# Patient Record
Sex: Male | Born: 1955 | Race: Black or African American | Hispanic: No | Marital: Single | Smoking: Never smoker
Health system: Southern US, Community
[De-identification: ages and names within clinical notes are randomized; demographics above are authoritative.]

## PROBLEM LIST (undated history)

## (undated) DIAGNOSIS — I10 Essential (primary) hypertension: Secondary | ICD-10-CM

## (undated) DIAGNOSIS — H9190 Unspecified hearing loss, unspecified ear: Secondary | ICD-10-CM

## (undated) HISTORY — PX: CHOLECYSTECTOMY: SHX55

---

## 2007-03-01 ENCOUNTER — Emergency Department (HOSPITAL_COMMUNITY): Admission: EM | Admit: 2007-03-01 | Discharge: 2007-03-01 | Payer: Self-pay | Admitting: Emergency Medicine

## 2010-10-16 ENCOUNTER — Inpatient Hospital Stay (HOSPITAL_COMMUNITY)
Admission: EM | Admit: 2010-10-16 | Discharge: 2010-10-19 | DRG: 419 | Disposition: A | Discharge status: Home / Self Care | Payer: Medicaid Other | Attending: Emergency Medicine | Admitting: Emergency Medicine

## 2010-10-16 DIAGNOSIS — E860 Dehydration: Secondary | ICD-10-CM | POA: Diagnosis present

## 2010-10-16 DIAGNOSIS — K8000 Calculus of gallbladder with acute cholecystitis without obstruction: Principal | ICD-10-CM | POA: Diagnosis present

## 2010-10-16 DIAGNOSIS — I10 Essential (primary) hypertension: Secondary | ICD-10-CM | POA: Diagnosis present

## 2010-10-16 DIAGNOSIS — H9190 Unspecified hearing loss, unspecified ear: Secondary | ICD-10-CM | POA: Diagnosis present

## 2010-10-16 LAB — POCT I-STAT, CHEM 8
Chloride: 103 mEq/L (ref 96–112)
Creatinine, Ser: 1.6 mg/dL — ABNORMAL HIGH (ref 0.4–1.5)
Glucose, Bld: 116 mg/dL — ABNORMAL HIGH (ref 70–99)
HCT: 48 % (ref 39.0–52.0)
Potassium: 3.9 mEq/L (ref 3.5–5.1)
Sodium: 134 mEq/L — ABNORMAL LOW (ref 135–145)

## 2010-10-16 LAB — CBC
HCT: 45 % (ref 39.0–52.0)
Hemoglobin: 14.9 g/dL (ref 13.0–17.0)
MCV: 81.2 fL (ref 78.0–100.0)
RDW: 13.1 % (ref 11.5–15.5)
WBC: 22.2 10*3/uL — ABNORMAL HIGH (ref 4.0–10.5)

## 2010-10-16 LAB — COMPREHENSIVE METABOLIC PANEL
AST: 19 U/L (ref 0–37)
Albumin: 3.6 g/dL (ref 3.5–5.2)
BUN: 15 mg/dL (ref 6–23)
Calcium: 8.7 mg/dL (ref 8.4–10.5)
Chloride: 100 mEq/L (ref 96–112)
Creatinine, Ser: 1.45 mg/dL (ref 0.4–1.5)
GFR calc Af Amer: >60 mL/min (ref >60)
Total Protein: 7.4 g/dL (ref 6.0–8.3)

## 2010-10-16 LAB — DIFFERENTIAL
Eosinophils Relative: 0 % (ref 0–5)
Lymphocytes Relative: 7 % — ABNORMAL LOW (ref 12–46)
Lymphs Abs: 1.6 10*3/uL (ref 0.7–4.0)
Monocytes Absolute: 2.8 10*3/uL — ABNORMAL HIGH (ref 0.1–1.0)
Neutro Abs: 17.7 10*3/uL — ABNORMAL HIGH (ref 1.7–7.7)

## 2010-10-16 LAB — PROTIME-INR: INR: 1.2 (ref 0.00–1.49)

## 2010-10-16 LAB — POCT CARDIAC MARKERS
CKMB, poc: 1.2 ng/mL (ref 1.0–8.0)
Myoglobin, poc: 70.3 ng/mL (ref 12–200)
Troponin i, poc: <0.05 ng/mL (ref 0.00–0.09)

## 2010-10-17 LAB — CBC
HCT: 39.1 % (ref 39.0–52.0)
Hemoglobin: 12.3 g/dL — ABNORMAL LOW (ref 13.0–17.0)
MCH: 26 pg (ref 26.0–34.0)
MCV: 82.7 fL (ref 78.0–100.0)
Platelets: 242 10*3/uL (ref 150–400)
RBC: 4.73 MIL/uL (ref 4.22–5.81)
WBC: 13.8 10*3/uL — ABNORMAL HIGH (ref 4.0–10.5)

## 2010-10-17 LAB — COMPREHENSIVE METABOLIC PANEL
AST: 32 U/L (ref 0–37)
Albumin: 2.8 g/dL — ABNORMAL LOW (ref 3.5–5.2)
Alkaline Phosphatase: 59 U/L (ref 39–117)
BUN: 13 mg/dL (ref 6–23)
Chloride: 107 mEq/L (ref 96–112)
GFR calc Af Amer: >60 mL/min (ref >60)
Potassium: 4.2 mEq/L (ref 3.5–5.1)
Total Bilirubin: 1.7 mg/dL — ABNORMAL HIGH (ref 0.3–1.2)
Total Protein: 6.2 g/dL (ref 6.0–8.3)

## 2010-10-17 LAB — CARDIAC PANEL(CRET KIN+CKTOT+MB+TROPI)
CK, MB: 1.8 ng/mL (ref 0.3–4.0)
Total CK: 76 U/L (ref 7–232)

## 2010-10-18 LAB — SURGICAL PCR SCREEN
MRSA, PCR: NEGATIVE
Staphylococcus aureus: NEGATIVE

## 2010-11-09 NOTE — Op Note (Signed)
NAME:  Joseph Hansen, Joseph Hansen NO.:  [id number]  MEDICAL RECORD NO.:  [id number]          PATIENT TYPE:  INP  LOCATION:  5507                         FACILITY:  [facility]  PHYSICIAN:  Velora Heckler, MD      DATE OF BIRTH:  20-Mar-1956  DATE OF PROCEDURE:  10/17/2010 DATE OF DISCHARGE:                              OPERATIVE REPORT   PREOPERATIVE DIAGNOSES:  Acute cholecystitis, cholelithiasis.  POSTOPERATIVE DIAGNOSIS:  Acute cholecystitis, cholelithiasis.  PROCEDURE:  Laparoscopic cholecystectomy with intraoperative cholangiography.  SURGEON:  Velora Heckler, MD  ASSISTANT:  Eber Hong, PA  ANESTHESIA:  General per Judie Petit, MD  ESTIMATED BLOOD LOSS:  Minimal.  PREPARATION:  ChloraPrep.  COMPLICATIONS:  None.  INDICATIONS:  The patient is a 55 year old black male admitted on the Surgical Service from the emergency department with 4-day history of right upper quadrant abdominal pain, nausea and vomiting.  The patient's white blood cell count was elevated at 22,000.  CT scan abdomen and pelvis showed a thickened gallbladder containing gallstones.  The patient now comes to surgery for cholecystectomy for acute cholecystitis and cholelithiasis.  PROCEDURE IN DETAIL:  Procedure was done in OR #17 at the [facility].  The patient was brought to the operating room, placed in supine position on the operating room table.  Following administration of general anesthesia, the patient is positioned and then prepped and draped in the usual strict aseptic fashion.  After ascertaining that an adequate level of anesthesia had been achieved, an infraumbilical incision was made in the midline with a #15 blade. Dissection was carried down through the subcutaneous tissues.  Fascia was incised in the midline and the peritoneal cavity was entered cautiously.  0 Vicryl pursestring suture was placed in the fascia, and Hasson cannula was introduced  under direct vision and secured with a pursestring suture.  Abdomen was insufflated with carbon dioxide. Laparoscope was introduced and the abdomen explored.  Operative ports were placed in the right upper quadrant in the midline, midclavicular line, and anterior axillary line.  With some difficulty, the omentum was mobilized off the undersurface of the liver in the gallbladder. Gallbladder was identified after puncturing the dome of the gallbladder with the suction evacuator.  Gallbladder wall was then grasped and retracted cephalad.  Gallbladder contains concretions of stones, which made it difficult to grasp.  It was notably thick walled.  There is edema in the tissues.  With some difficulty, the neck of the gallbladder was grasped.  The peritoneum was incised and the neck of the gallbladder was dissected out.  Cystic duct was identified.  A clip was placed at the neck of the gallbladder.  A small branch of the cystic artery was clipped and divided with the electrocautery.  Cystic duct was incised with the scissors.  Yellow bile emanates from the cystic duct.  Using a Cook cholangiography catheter through a stab wound in the right upper quadrant, the duct was cannulated.  It was secured with a ligature clip. Using C-arm fluoroscopy, real time cholangiography was performed.  There was rapid filling of a normal caliber biliary tree.  There was free flow distally into the duodenum without filling defect or obstruction.  There was reflux of contrast into the right and left hepatic ductal systems. Clip was withdrawn and Cook catheter was removed from the peritoneal cavity.  Cystic duct was triply clipped and divided.  Branches of the cystic artery were dissected out individually and divided between Ligaclips.  Gallbladder was then excised from the gallbladder bed using the hook electrocautery for hemostasis.  Gallbladder was decidedly intrahepatic and requires extensive dissection of the  liver parenchyma in order to completely excise the gallbladder.  It was placed into an EndoCatch bag and then withdrawn through the umbilical port.  0 Vicryl pursestring suture was tied securely.  Right upper quadrant was irrigated copiously with warm saline.  A 19-French Blake drain was brought in from the lateral port site and placed beneath the liver. Drain was secured to the skin with a 3-0 nylon suture.  Good hemostasis was noted.  Fluid was evacuated from the peritoneal cavity.  Ports were removed and good hemostasis was noted at all port sites. Pneumoperitoneum was released.  Drain was placed to bulb suction.  Port sites were anesthetized with local anesthetic.  Wounds were closed with interrupted 4-0 Monocryl subcuticular sutures.  Wounds were washed and dried and Benzoin and Steri-Strips were applied.  Sterile dressings are applied.  The patient was awakened from anesthesia and brought to the recovery room.  The patient tolerated the procedure well.     Velora Heckler, MD     TMG/MEDQ  D:  10/17/2010  T:  10/18/2010  Job:  [id number]  Electronically Signed by Darnell Level MD on 11/09/2010 10:27:56 AM

## 2010-11-09 NOTE — Discharge Summary (Signed)
NAME:  Joseph Hansen, Joseph Hansen NO.:  [id number]  MEDICAL RECORD NO.:  [id number]           PATIENT TYPE:  I  LOCATION:  5507                         FACILITY:  [facility]  PHYSICIAN:  Velora Heckler, MD      DATE OF BIRTH:  [birth year]  DATE OF ADMISSION:  10/16/2010 DATE OF DISCHARGE:  10/19/2010                              DISCHARGE SUMMARY   HISTORY OF PRESENT ILLNESS:  [name] is a 55 year old gentleman who presented with approximately 4-5 days of right upper quadrant pain associated with nausea and vomiting.  He also did have a history of uncontrolled hypertension and presumptive hypertensive as well. Therefore, the emergency department did work him up for possibility of atypical chest pain including an EKG which did show some ST-segment changes in both inferior and anterior leads, however, that was not suspicious for acute MI.  His cardiac enzymes were within normal limits upon evaluation in the emergency department.  However, workup consisted of labs and diagnostic studies.  Interestingly, his white blood cell count was elevated at 22,000.  His bilirubin was slightly elevated at 1.6.  This prompted a CT of the abdomen and pelvis which subsequently showed evidence of contracted gallbladder with a large stone with some gallbladder wall thickening and pericholecystic edema.  These findings were subsequently relayed for a surgical consult.  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  Interpreter services were used throughout his hospital course.  SUMMARY OF HOSPITAL COURSE:  The patient was subsequently admitted on October 16, 2010, after consultation by Dr. Darnell Level.  Decision was made to admit the patient for IV antibiotics, IV fluids, and control of his uncontrolled hypertension.  After 24 hours at which point the patient's cardiac enzymes were repeated and continued to remain normal. His white blood cell count has improved  from 22,000-14,000.  At this point, with the patient feeling better and being a little bit more medically resuscitated, it was decided that the patient should be acceptable for operative intervention.  The patient was taken to the operating room on October 17, 2010, and underwent laparoscopic cholecystectomy with intraoperative cholangiogram.  Cholangiogram did not show any evidence of obstruction. The gallbladder was quite acute, but I was successful in removal through the laparoscopic technique.  Postoperatively, the patient has done well without any residual nausea or vomiting.  His pain control has been converted to oral form.  His blood pressure has remained stable on the IV medications and he will be transitioned to oral medications. However, we felt at this point on postoperative day #2 that the patient is stable for discharge home.  DISCHARGE DIAGNOSES: 1. Acute cholecystitis, status post laparoscopic cholecystectomy. 2. Uncontrolled hypertension - improved on hypertensive     antihypertensive agents.  PLAN:  The patient will be discharged on the following medications: Lisinopril 10 mg daily, Lopressor 25 mg twice daily, and Percocet 1-2 tablets q.6 h. p.r.n. severe pain.  He is given preprinted discharge instructions to follow regarding laparoscopic cholecystectomy surgery. He is also given instructions to follow a low-sodium diet to aid in his blood pressure  management.  He is urged to follow up with primary care team within the next coming weeks for blood pressure management.  Given the EKG findings and his chronic uncontrolled hypertension, the patient may be a candidate for cardiology consult or stress testing.  He will come back in our office in approximately 1 week to have his drain removed or sooner should any issues arise.     Brayton El, PA-C   ______________________________ Velora Heckler, MD    KB/MEDQ  D:  10/19/2010  T:  10/20/2010  Job:   [id number]  Electronically Signed by Brayton El  on 10/23/2010 01:34:10 PM Electronically Signed by Darnell Level MD on 11/09/2010 10:26:48 AM

## 2010-11-09 NOTE — H&P (Signed)
NAME:  Joseph Hansen, Joseph Hansen NO.:  [id number]  MEDICAL RECORD NO.:  [id number]          PATIENT TYPE:  INP  LOCATION:  5507                         FACILITY:  [facility]  PHYSICIAN:  Velora Heckler, MD      DATE OF BIRTH:  [birth year]  DATE OF ADMISSION:  10/16/2010 DATE OF DISCHARGE:                             HISTORY & PHYSICAL   PRIMARY CARE PHYSICIAN:  [facility].  CHIEF COMPLAINT:  Abdominal pain.  HISTORY OF PRESENT ILLNESS:  Joseph Hansen is a 55 year old pleasant African American gentleman who presents with approximately 4 days of right upper quadrant abdominal pain associated with nausea and vomiting. He states his bowel movements have become less frequent.  However, he does not feel distended or bloated.  He has not had any fever or chills. He denies any chest pain, shortness of breath, or palpitations.  He denies any dysuria, hematuria, has had no prior history of any abdominal or gastrointestinal disorders.  No known history of gallbladder disease. He was brought to the emergency department by his family with these ongoing complaints and workup in the emergency department has found evidence possibly consistent with acute cholecystitis.  CT scan shows gallstones and gallbladder wall thickening as well as an elevated white blood cell count.  PAST MEDICAL HISTORY:  Significant for hypertension for which the patient has not taken any medication in some time.  He is deaf.  His mother is his power of attorney.  PAST SURGICAL HISTORY:  Negative.  FAMILY HISTORY:  Noncontributory at present case.  SOCIAL HISTORY:  The patient denies any tobacco, alcohol, or illicit drug use.  Again, he is deaf and thus communicates with sign language.  DRUG ALLERGIES:  No known drug or latex allergies.  MEDICATIONS:  None chronic.  REVIEW OF SYSTEMS:  Please see history of present illness for pertinent findings.  PHYSICAL EXAMINATION:  GENERAL:  A 55 year old African  American gentleman who is nontoxic-appearing. VITAL SIGNS:  Temperature is 98.7, blood pressure 170/115, respiratory rate of 24, heart rate of 94, oxygen saturation 96-97% on room air. ENT:  Unremarkable. NECK:  Supple without lymphadenopathy.  Trachea is midline.  No thyromegaly or masses. LUNGS:  Clear to auscultation.  No wheezes, rhonchi, or rales.  Normal respiratory effort without use of accessory muscles. HEART:  Regular rate and rhythm.  No murmurs, gallops, or rubs. Carotids 2+ brisk and without bruit.  Peripheral pulses are intact and symmetrical. ABDOMEN:  Soft and nondistended.  The patient is tender in the right upper quadrant without guarding or rebound.  No mass effect or hernias are appreciated.  No organomegaly is evident. RECTAL:  Deferred. EXTREMITIES:  Good active range of motion in all extremities without crepitus or pain.  Normal muscle strength and tone without atrophy. SKIN:  Otherwise warm and dry with good turgor.  No rashes, lesions, or nodules. NEUROLOGIC:  The patient is alert and oriented x3.  Cranial nerves II- XII are grossly intact.  DIAGNOSTICS:  CBC today shows a white blood cell count of 22.2 with a mild shift, hemoglobin of 14.9, hematocrit of 45.0, platelet count of 264.  Metabolic panel  shows sodium of 134, potassium of 3.9, chloride of 100, CO2 of 22, BUN of 16, creatinine of 1.6.  Blood sugar of 114.  Liver enzymes show bilirubin of 1.6, alkaline phosphatase of 68, AST of 19, ALT of 21.  Lipase is normal at 18.  INR is normal at 1.2.  IMAGING:  Chest x-ray showed poor respiratory effort, but possible patchy airspace opacity in the right lung base.  Subsequent CT scan shows bibasilar atelectasis, likely due to poor respiratory effort. However, it also shows contracted gallbladder with a 2.5 cm gallstone as well as a 1.5 cm gallstone.  There is an associated wall thickening and pericholecystic fluid.  IMPRESSION: 1. Acute  cholecystitis. 2. Uncontrolled hypertension. 3. Dehydration.  PLAN:  We will admit the patient to continue IV fluid hydration, begin IV antibiotics consisting of Unasyn 3 g IV q.6 h. and we will plan for cholecystectomy this admit.  I have discussed with the patient via sign language interpreter as well as his family the need for surgical intervention as well as the procedure itself including risks, complications, and postoperative expectations.  The patient and family were agreeable to our plan.     Brayton El, PA-C   ______________________________ Velora Heckler, MD    KB/MEDQ  D:  10/16/2010  T:  10/17/2010  Job:  [id number]  Electronically Signed by Brayton El  on 10/18/2010 10:51:03 AM Electronically Signed by Darnell Level MD on 11/09/2010 10:27:10 AM

## 2011-07-05 LAB — BASIC METABOLIC PANEL
BUN: 15
CO2: 25
Calcium: 9.1
Chloride: 110
Creatinine, Ser: 1.14

## 2011-07-05 LAB — URINALYSIS, ROUTINE W REFLEX MICROSCOPIC
Hgb urine dipstick: NEGATIVE
Nitrite: NEGATIVE
Specific Gravity, Urine: >1.03 — ABNORMAL HIGH
Urobilinogen, UA: 0.2
pH: 6

## 2011-07-05 LAB — URINE CULTURE

## 2011-07-05 LAB — DIFFERENTIAL
Basophils Absolute: 0
Basophils Relative: 1
Eosinophils Absolute: 0.2
Monocytes Relative: 12 — ABNORMAL HIGH
Neutrophils Relative %: 58

## 2011-07-05 LAB — URINE MICROSCOPIC-ADD ON

## 2011-07-05 LAB — POCT CARDIAC MARKERS
Myoglobin, poc: 98.9
Operator id: 264761

## 2011-07-05 LAB — CBC
MCHC: 33.7
MCV: 81
Platelets: 281
RDW: 13.7

## 2015-03-26 ENCOUNTER — Emergency Department (HOSPITAL_COMMUNITY): Payer: Medicaid Other

## 2015-03-26 ENCOUNTER — Encounter (HOSPITAL_COMMUNITY): Payer: Self-pay | Admitting: Emergency Medicine

## 2015-03-26 ENCOUNTER — Emergency Department (HOSPITAL_COMMUNITY)
Admission: EM | Admit: 2015-03-26 | Discharge: 2015-03-26 | Disposition: A | Discharge status: Home / Self Care | Payer: Medicaid Other | Attending: Emergency Medicine | Admitting: Emergency Medicine

## 2015-03-26 DIAGNOSIS — Z79899 Other long term (current) drug therapy: Secondary | ICD-10-CM | POA: Diagnosis not present

## 2015-03-26 DIAGNOSIS — I10 Essential (primary) hypertension: Secondary | ICD-10-CM | POA: Diagnosis present

## 2015-03-26 DIAGNOSIS — R0600 Dyspnea, unspecified: Secondary | ICD-10-CM | POA: Diagnosis not present

## 2015-03-26 LAB — CBC WITH DIFFERENTIAL/PLATELET
Basophils Absolute: 0.1 10*3/uL (ref 0.0–0.1)
Basophils Relative: 1 % (ref 0–1)
EOS PCT: 4 % (ref 0–5)
Eosinophils Absolute: 0.4 10*3/uL (ref 0.0–0.7)
HEMATOCRIT: 40.5 % (ref 39.0–52.0)
Hemoglobin: 12.9 g/dL — ABNORMAL LOW (ref 13.0–17.0)
LYMPHS ABS: 2.3 10*3/uL (ref 0.7–4.0)
LYMPHS PCT: 24 % (ref 12–46)
MCH: 26.8 pg (ref 26.0–34.0)
MCHC: 31.9 g/dL (ref 30.0–36.0)
MCV: 84.2 fL (ref 78.0–100.0)
MONO ABS: 0.9 10*3/uL (ref 0.1–1.0)
MONOS PCT: 9 % (ref 3–12)
Neutro Abs: 5.8 10*3/uL (ref 1.7–7.7)
Neutrophils Relative %: 62 % (ref 43–77)
PLATELETS: 293 10*3/uL (ref 150–400)
RBC: 4.81 MIL/uL (ref 4.22–5.81)
RDW: 13.2 % (ref 11.5–15.5)
WBC: 9.4 10*3/uL (ref 4.0–10.5)

## 2015-03-26 LAB — COMPREHENSIVE METABOLIC PANEL
ALBUMIN: 4 g/dL (ref 3.5–5.0)
ALT: 23 U/L (ref 17–63)
AST: 21 U/L (ref 15–41)
Alkaline Phosphatase: 89 U/L (ref 38–126)
Anion gap: 8 (ref 5–15)
BILIRUBIN TOTAL: 0.8 mg/dL (ref 0.3–1.2)
BUN: 19 mg/dL (ref 6–20)
CHLORIDE: 107 mmol/L (ref 101–111)
CO2: 25 mmol/L (ref 22–32)
Calcium: 8.9 mg/dL (ref 8.9–10.3)
Creatinine, Ser: 1.29 mg/dL — ABNORMAL HIGH (ref 0.61–1.24)
GFR calc Af Amer: >60 mL/min (ref >60)
GFR, EST NON AFRICAN AMERICAN: 58 mL/min — AB (ref >60)
Glucose, Bld: 75 mg/dL (ref 65–99)
Potassium: 3.8 mmol/L (ref 3.5–5.1)
SODIUM: 140 mmol/L (ref 135–145)
Total Protein: 7.2 g/dL (ref 6.5–8.1)

## 2015-03-26 LAB — I-STAT CHEM 8, ED
BUN: 19 mg/dL (ref 6–20)
CALCIUM ION: 1.22 mmol/L (ref 1.13–1.30)
Chloride: 105 mmol/L (ref 101–111)
Creatinine, Ser: 1.3 mg/dL — ABNORMAL HIGH (ref 0.61–1.24)
Glucose, Bld: 76 mg/dL (ref 65–99)
HEMATOCRIT: 44 % (ref 39.0–52.0)
Hemoglobin: 15 g/dL (ref 13.0–17.0)
Potassium: 3.9 mmol/L (ref 3.5–5.1)
SODIUM: 141 mmol/L (ref 135–145)
TCO2: 22 mmol/L (ref 0–100)

## 2015-03-26 MED ORDER — AMLODIPINE BESYLATE 10 MG PO TABS: 10.0000 mg | ORAL_TABLET | Freq: Every day | ORAL | Status: DC

## 2015-03-26 MED ORDER — BENAZEPRIL HCL 10 MG PO TABS: 10.0000 mg | ORAL_TABLET | Freq: Every day | ORAL | Status: DC

## 2015-03-26 NOTE — Discharge Instructions (Signed)
Hypertension Hypertension is another name for high blood pressure. High blood pressure forces your heart to work harder to pump blood. A blood pressure reading has two numbers, which includes a higher number over a lower number (example: 110/72). HOME CARE   Have your blood pressure rechecked by your doctor.  Only take medicine as told by your doctor. Follow the directions carefully. The medicine does not work as well if you skip doses. Skipping doses also puts you at risk for problems.  Do not smoke.  Monitor your blood pressure at home as told by your doctor. GET HELP IF:  You think you are having a reaction to the medicine you are taking.  You have repeat headaches or feel dizzy.  You have puffiness (swelling) in your ankles.  You have trouble with your vision. GET HELP RIGHT AWAY IF:   You get a very bad headache and are confused.  You feel weak, numb, or faint.  You get chest or belly (abdominal) pain.  You throw up (vomit).  You cannot breathe very well. MAKE SURE YOU:   Understand these instructions.  Will watch your condition.  Will get help right away if you are not doing well or get worse. Document Released: 02/20/2008 Document Revised: 09/08/2013 Document Reviewed: 06/26/2013 Exit[employer] Patient Information 2015 [employer]. This information is not intended to replace advice given to you by your health care provider. Make sure you discuss any questions you have with your health care provider.  

## 2015-03-26 NOTE — ED Notes (Addendum)
PT is deaf and family reports not being seen by a primary doctor in over 3 years and the past week has been having s/s of HTN including headache and intermittent lightheadedness. PT denies any CP or weakness.

## 2015-03-26 NOTE — ED Provider Notes (Signed)
CSN: [id number]     Arrival date & time 03/26/15  1412 History   First MD Initiated Contact with Patient 03/26/15 1441     Chief Complaint  Patient presents with  . Hypertension    Level V caveat patient is deaf (Consider location/radiation/quality/duration/timing/severity/associated sxs/prior Treatment) HPI History obtained from patient's sister. 59 year old male with a history of hypertension who has not been taking his antihypertensives. He lives with his parents. By his sister's report his mother got a new blood pressure machine yesterday. She was checking everyone's blood pressure and noted that his was very elevated with a systolic of about 672. They rechecked it today and it was again elevated at 200. They deny that he has been having any other complaints and has been his usual self. History reviewed. No pertinent past medical history. Past Surgical History  Procedure Laterality Date  . Cholecystectomy     History reviewed. No pertinent family history. History  Substance Use Topics  . Smoking status: Never Smoker   . Smokeless tobacco: Not on file  . Alcohol Use: No    Review of Systems  Unable to perform ROS     Allergies  Review of patient's allergies indicates no known allergies.  Home Medications   Prior to Admission medications   Medication Sig Start Date End Date Taking? Authorizing Provider  amLODipine (NORVASC) 10 MG tablet Take 1 tablet (10 mg total) by mouth daily. 03/26/15   Pattricia Boss, MD  benazepril (LOTENSIN) 10 MG tablet Take 1 tablet (10 mg total) by mouth daily. 03/26/15   Pattricia Boss, MD   BP 186/107 mmHg  Pulse 63  Temp(Src) 98.3 F (36.8 C) (Oral)  Resp 18  Ht 5\' 7"  (1.702 m)  Wt 182 lb 6 oz (82.725 kg)  BMI 28.56 kg/m2  SpO2 98% Physical Exam  Constitutional: He appears well-developed and well-nourished.  HENT:  Head: Normocephalic and atraumatic.  Right Ear: External ear normal.  Left Ear: External ear normal.  Nose: Nose normal.   Mouth/Throat: Oropharynx is clear and moist.  Eyes: Conjunctivae and EOM are normal. Pupils are equal, round, and reactive to light.  Neck: Normal range of motion. Neck supple.  Cardiovascular: Normal rate, regular rhythm and normal heart sounds.   Pulmonary/Chest: Effort normal and breath sounds normal.  Abdominal: Soft. Bowel sounds are normal.  Musculoskeletal: Normal range of motion.  Neurological: He is alert. He displays normal reflexes. No cranial nerve deficit. He exhibits normal muscle tone. Coordination normal.  Skin: Skin is warm and dry.  Psychiatric: He has a normal mood and affect.    ED Course  Procedures (including critical care time) Labs Review Labs Reviewed  CBC WITH DIFFERENTIAL/PLATELET  COMPREHENSIVE METABOLIC PANEL  I-STAT CHEM 8, ED    Imaging Review No results found.   EKG Interpretation None      MDM   Final diagnoses:  Dyspnea  Essential hypertension    59 year old male history of hypertension not taking medications for several years who presents today with hypertension. He is asymptomatic. Labs checked here showed an initial i-STAT appeared to be hemolyzed repeat was normal. Exception of a mildly elevated creatinine. He is given prescription for Norvasc and Lotensin per antihypertensives guidelines. He is advised to follow-up next week for recheck.    Pattricia Boss, MD 03/26/15 [phone]

## 2015-05-08 ENCOUNTER — Encounter (HOSPITAL_COMMUNITY): Payer: Self-pay | Admitting: Emergency Medicine

## 2015-05-08 ENCOUNTER — Emergency Department (HOSPITAL_COMMUNITY)
Admission: EM | Admit: 2015-05-08 | Discharge: 2015-05-08 | Disposition: A | Discharge status: Home / Self Care | Payer: Medicaid Other | Attending: Emergency Medicine | Admitting: Emergency Medicine

## 2015-05-08 ENCOUNTER — Emergency Department (HOSPITAL_COMMUNITY): Payer: Medicaid Other

## 2015-05-08 DIAGNOSIS — Y998 Other external cause status: Secondary | ICD-10-CM | POA: Diagnosis not present

## 2015-05-08 DIAGNOSIS — S40212A Abrasion of left shoulder, initial encounter: Secondary | ICD-10-CM | POA: Diagnosis not present

## 2015-05-08 DIAGNOSIS — S50811A Abrasion of right forearm, initial encounter: Secondary | ICD-10-CM | POA: Insufficient documentation

## 2015-05-08 DIAGNOSIS — S30811A Abrasion of abdominal wall, initial encounter: Secondary | ICD-10-CM | POA: Insufficient documentation

## 2015-05-08 DIAGNOSIS — S90812A Abrasion, left foot, initial encounter: Secondary | ICD-10-CM | POA: Diagnosis not present

## 2015-05-08 DIAGNOSIS — Y9389 Activity, other specified: Secondary | ICD-10-CM | POA: Diagnosis not present

## 2015-05-08 DIAGNOSIS — T07XXXA Unspecified multiple injuries, initial encounter: Secondary | ICD-10-CM

## 2015-05-08 DIAGNOSIS — I10 Essential (primary) hypertension: Secondary | ICD-10-CM | POA: Insufficient documentation

## 2015-05-08 DIAGNOSIS — S81011A Laceration without foreign body, right knee, initial encounter: Secondary | ICD-10-CM

## 2015-05-08 DIAGNOSIS — S90811A Abrasion, right foot, initial encounter: Secondary | ICD-10-CM | POA: Insufficient documentation

## 2015-05-08 DIAGNOSIS — Y92410 Unspecified street and highway as the place of occurrence of the external cause: Secondary | ICD-10-CM | POA: Insufficient documentation

## 2015-05-08 DIAGNOSIS — S0993XA Unspecified injury of face, initial encounter: Secondary | ICD-10-CM | POA: Diagnosis present

## 2015-05-08 DIAGNOSIS — S0181XA Laceration without foreign body of other part of head, initial encounter: Secondary | ICD-10-CM | POA: Insufficient documentation

## 2015-05-08 HISTORY — DX: Essential (primary) hypertension: I10

## 2015-05-08 LAB — COMPREHENSIVE METABOLIC PANEL
ALT: 16 U/L — ABNORMAL LOW (ref 17–63)
ANION GAP: 10 (ref 5–15)
AST: 18 U/L (ref 15–41)
Albumin: 4.2 g/dL (ref 3.5–5.0)
Alkaline Phosphatase: 73 U/L (ref 38–126)
BUN: 40 mg/dL — ABNORMAL HIGH (ref 6–20)
CALCIUM: 9 mg/dL (ref 8.9–10.3)
CHLORIDE: 105 mmol/L (ref 101–111)
CO2: 21 mmol/L — AB (ref 22–32)
Creatinine, Ser: 2.1 mg/dL — ABNORMAL HIGH (ref 0.61–1.24)
GFR calc Af Amer: 37 mL/min — ABNORMAL LOW (ref >60)
GFR calc non Af Amer: 32 mL/min — ABNORMAL LOW (ref >60)
Glucose, Bld: 127 mg/dL — ABNORMAL HIGH (ref 65–99)
Potassium: 4.1 mmol/L (ref 3.5–5.1)
SODIUM: 136 mmol/L (ref 135–145)
Total Bilirubin: 0.6 mg/dL (ref 0.3–1.2)
Total Protein: 7.5 g/dL (ref 6.5–8.1)

## 2015-05-08 LAB — URINALYSIS, ROUTINE W REFLEX MICROSCOPIC
Bilirubin Urine: NEGATIVE
Glucose, UA: NEGATIVE mg/dL
Hgb urine dipstick: NEGATIVE
KETONES UR: NEGATIVE mg/dL
Leukocytes, UA: NEGATIVE
Nitrite: NEGATIVE
PH: 5.5 (ref 5.0–8.0)
Protein, ur: NEGATIVE mg/dL
Specific Gravity, Urine: 1.02 (ref 1.005–1.030)
Urobilinogen, UA: 0.2 mg/dL (ref 0.0–1.0)

## 2015-05-08 LAB — CBC
HEMATOCRIT: 39.4 % (ref 39.0–52.0)
HEMOGLOBIN: 13.2 g/dL (ref 13.0–17.0)
MCH: 27.8 pg (ref 26.0–34.0)
MCHC: 33.5 g/dL (ref 30.0–36.0)
MCV: 82.9 fL (ref 78.0–100.0)
Platelets: 254 10*3/uL (ref 150–400)
RBC: 4.75 MIL/uL (ref 4.22–5.81)
RDW: 12.9 % (ref 11.5–15.5)
WBC: 10.3 10*3/uL (ref 4.0–10.5)

## 2015-05-08 LAB — I-STAT CG4 LACTIC ACID, ED: Lactic Acid, Venous: 1.52 mmol/L (ref 0.5–2.0)

## 2015-05-08 LAB — SAMPLE TO BLOOD BANK

## 2015-05-08 LAB — PROTIME-INR
INR: 1.14 (ref 0.00–1.49)
Prothrombin Time: 14.7 seconds (ref 11.6–15.2)

## 2015-05-08 LAB — ETHANOL

## 2015-05-08 MED ORDER — IOHEXOL 300 MG/ML  SOLN
100.0000 mL | Freq: Once | INTRAMUSCULAR | Status: AC | PRN
  Administered 2015-05-08: 100 mL via INTRAVENOUS

## 2015-05-08 MED ORDER — POVIDONE-IODINE 10 % EX SOLN
CUTANEOUS | Status: AC
  Filled 2015-05-08: qty 118

## 2015-05-08 MED ORDER — SODIUM CHLORIDE 0.9 % IV SOLN
1000.0000 mL | Freq: Once | INTRAVENOUS | Status: AC
  Administered 2015-05-08: 1000 mL via INTRAVENOUS

## 2015-05-08 MED ORDER — BACITRACIN ZINC 500 UNIT/GM EX OINT: 1.0000 "application " | TOPICAL_OINTMENT | Freq: Two times a day (BID) | CUTANEOUS | Status: DC

## 2015-05-08 MED ORDER — HYDROCODONE-ACETAMINOPHEN 5-325 MG PO TABS: 1.0000 | ORAL_TABLET | Freq: Four times a day (QID) | ORAL | Status: DC | PRN

## 2015-05-08 MED ORDER — LIDOCAINE-EPINEPHRINE (PF) 2 %-1:200000 IJ SOLN
INTRAMUSCULAR | Status: AC
  Administered 2015-05-08: 20 mL
  Filled 2015-05-08: qty 20

## 2015-05-08 MED ORDER — TETANUS-DIPHTH-ACELL PERTUSSIS 5-2.5-18.5 LF-MCG/0.5 IM SUSP
0.5000 mL | Freq: Once | INTRAMUSCULAR | Status: AC
  Administered 2015-05-08: 0.5 mL via INTRAMUSCULAR
  Filled 2015-05-08: qty 0.5

## 2015-05-08 MED ORDER — HYDROCODONE-ACETAMINOPHEN 5-325 MG PO TABS
1.0000 | ORAL_TABLET | Freq: Once | ORAL | Status: AC
  Administered 2015-05-08: 1 via ORAL
  Filled 2015-05-08: qty 1

## 2015-05-08 NOTE — ED Notes (Signed)
Abrasions to right forearm and right hand cleaned and non-adherent dressing with Kerlix applied. Deep abrasions cleaned and dressed with non-adherent and Kerlix on right knee and Great R toe.

## 2015-05-08 NOTE — ED Notes (Signed)
MD at bedside. 
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
## 2015-05-08 NOTE — Discharge Instructions (Signed)
Abrasion An abrasion is a cut or scrape of the skin. Abrasions do not extend through all layers of the skin and most heal within 10 days. It is important to care for your abrasion properly to prevent infection. CAUSES  Most abrasions are caused by falling on, or gliding across, the ground or other surface. When your skin rubs on something, the outer and inner layer of skin rubs off, causing an abrasion. DIAGNOSIS  Your caregiver will be able to diagnose an abrasion during a physical exam.  TREATMENT  Your treatment depends on how large and deep the abrasion is. Generally, your abrasion will be cleaned with water and a mild soap to remove any dirt or debris. An antibiotic ointment may be put over the abrasion to prevent an infection. A bandage (dressing) may be wrapped around the abrasion to keep it from getting dirty.  You may need a tetanus shot if:  You cannot remember when you had your last tetanus shot.  You have never had a tetanus shot.  The injury broke your skin. If you get a tetanus shot, your arm may swell, get red, and feel warm to the touch. This is common and not a problem. If you need a tetanus shot and you choose not to have one, there is a rare chance of getting tetanus. Sickness from tetanus can be serious.  HOME CARE INSTRUCTIONS   If a dressing was applied, change it at least once a day or as directed by your caregiver. If the bandage sticks, soak it off with warm water.   Wash the area with water and a mild soap to remove all the ointment 2 times a day. Rinse off the soap and pat the area dry with a clean towel.   Reapply any ointment as directed by your caregiver. This will help prevent infection and keep the bandage from sticking. Use gauze over the wound and under the dressing to help keep the bandage from sticking.   Change your dressing right away if it becomes wet or dirty.   Only take over-the-counter or prescription medicines for pain, discomfort, or fever as  directed by your caregiver.   Follow up with your caregiver within 24-48 hours for a wound check, or as directed. If you were not given a wound-check appointment, look closely at your abrasion for redness, swelling, or pus. These are signs of infection. SEEK IMMEDIATE MEDICAL CARE IF:   You have increasing pain in the wound.   You have redness, swelling, or tenderness around the wound.   You have pus coming from the wound.   You have a fever or persistent symptoms for more than 2-3 days.  You have a fever and your symptoms suddenly get worse.  You have a bad smell coming from the wound or dressing.  MAKE SURE YOU:   Understand these instructions.  Will watch your condition.  Will get help right away if you are not doing well or get worse. Document Released: 06/13/2005 Document Revised: 08/20/2012 Document Reviewed: 08/07/2011 [facility] Patient Information 2015 [place], [place]. This information is not intended to replace advice given to you by your health care provider. Make sure you discuss any questions you have with your health care provider.  Delayed Wound Closure Sometimes, your health care provider will decide to delay closing a wound for several days. This is done when the wound is badly bruised, dirty, gaping, or when it has been several hours since the injury happened. By delaying the closure of  your wound, the risk of infection is reduced. Wounds that are closed in 3-7 days after being cleaned up and dressed heal just as well as those that are closed right away. HOME CARE INSTRUCTIONS  Rest and elevate the injured area until the pain and swelling are gone.  Have your wound checked as instructed by your health care provider. SEEK MEDICAL CARE IF:  You develop unusual or increased swelling or redness around the wound.  You have increasing pain or tenderness.  There is increasing fluid (drainage) or a bad smelling drainage coming from the wound. Document Released:  09/03/2005 Document Revised: 09/08/2013 Document Reviewed: 03/03/2013 [facility] Patient Information 2015 [place], [place]. This information is not intended to replace advice given to you by your health care provider. Make sure you discuss any questions you have with your health care provider. Absorbable Suture Repair Absorbable sutures (stitches) hold skin together so you can heal. Keep skin wounds clean and dry for the next 2 to 3 days. Then, you may gently wash your wound and dress it with an antibiotic ointment as recommended. As your wound begins to heal, the sutures are no longer needed, and they typically begin to fall off. This will take 7 to 10 days. After 10 days, if your sutures are loose, you can remove them by wiping with a clean gauze pad or a cotton ball. Do not pull your sutures out. They should wipe away easily. If after 10 days they do not easily wipe away, have your caregiver take them out. Absorbable sutures may be used deep in a wound to help hold it together. If these stitches are below the skin, the body will absorb them completely in 3 to 4 weeks.  You may need a tetanus shot if:  You cannot remember when you had your last tetanus shot.  You have never had a tetanus shot. If you get a tetanus shot, your arm may swell, get red, and feel warm to the touch. This is common and not a problem. If you need a tetanus shot and you choose not to have one, there is a rare chance of getting tetanus. Sickness from tetanus can be serious. SEEK IMMEDIATE MEDICAL CARE IF:  You have redness in the wound area.  The wound area feels hot to the touch.  You develop swelling in the wound area.  You develop pain.  There is fluid drainage from the wound. Document Released: 10/11/2004 Document Revised: 11/26/2011 Document Reviewed: 01/23/2011 [facility] Patient Information 2015 [place], [place]. This information is not intended to replace advice given to you by your health care provider. Make sure you  discuss any questions you have with your health care provider.

## 2015-05-08 NOTE — ED Notes (Signed)
Pt. Resting comfortably. 
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
## 2019-06-02 ENCOUNTER — Encounter (HOSPITAL_COMMUNITY): Payer: Self-pay

## 2019-06-02 ENCOUNTER — Emergency Department (HOSPITAL_COMMUNITY): Payer: Medicare Other

## 2019-06-02 ENCOUNTER — Emergency Department (HOSPITAL_COMMUNITY)
Admission: EM | Admit: 2019-06-02 | Discharge: 2019-06-02 | Disposition: A | Discharge status: Home / Self Care | Payer: Medicare Other | Attending: Emergency Medicine | Admitting: Emergency Medicine

## 2019-06-02 ENCOUNTER — Other Ambulatory Visit: Payer: Self-pay

## 2019-06-02 DIAGNOSIS — R0789 Other chest pain: Secondary | ICD-10-CM | POA: Insufficient documentation

## 2019-06-02 DIAGNOSIS — I10 Essential (primary) hypertension: Secondary | ICD-10-CM | POA: Insufficient documentation

## 2019-06-02 DIAGNOSIS — Z79899 Other long term (current) drug therapy: Secondary | ICD-10-CM | POA: Insufficient documentation

## 2019-06-02 HISTORY — DX: Unspecified hearing loss, unspecified ear: H91.90

## 2019-06-02 LAB — COMPREHENSIVE METABOLIC PANEL
ALT: 16 U/L (ref 0–44)
AST: 18 U/L (ref 15–41)
Albumin: 4.6 g/dL (ref 3.5–5.0)
Alkaline Phosphatase: 70 U/L (ref 38–126)
Anion gap: 8 (ref 5–15)
BUN: 17 mg/dL (ref 8–23)
CO2: 26 mmol/L (ref 22–32)
Calcium: 9.6 mg/dL (ref 8.9–10.3)
Chloride: 106 mmol/L (ref 98–111)
Creatinine, Ser: 1.27 mg/dL — ABNORMAL HIGH (ref 0.61–1.24)
GFR calc Af Amer: >60 mL/min (ref >60)
GFR calc non Af Amer: 59 mL/min — ABNORMAL LOW (ref >60)
Glucose, Bld: 99 mg/dL (ref 70–99)
Potassium: 4 mmol/L (ref 3.5–5.1)
Sodium: 140 mmol/L (ref 135–145)
Total Bilirubin: 1 mg/dL (ref 0.3–1.2)
Total Protein: 8.4 g/dL — ABNORMAL HIGH (ref 6.5–8.1)

## 2019-06-02 LAB — CBC WITH DIFFERENTIAL/PLATELET
Abs Immature Granulocytes: 0.02 10*3/uL (ref 0.00–0.07)
Basophils Absolute: 0.1 10*3/uL (ref 0.0–0.1)
Basophils Relative: 1 %
Eosinophils Absolute: 0.3 10*3/uL (ref 0.0–0.5)
Eosinophils Relative: 3 %
HCT: 44.7 % (ref 39.0–52.0)
Hemoglobin: 13.9 g/dL (ref 13.0–17.0)
Immature Granulocytes: 0 %
Lymphocytes Relative: 20 %
Lymphs Abs: 1.9 10*3/uL (ref 0.7–4.0)
MCH: 27.2 pg (ref 26.0–34.0)
MCHC: 31.1 g/dL (ref 30.0–36.0)
MCV: 87.5 fL (ref 80.0–100.0)
Monocytes Absolute: 0.8 10*3/uL (ref 0.1–1.0)
Monocytes Relative: 8 %
Neutro Abs: 6.6 10*3/uL (ref 1.7–7.7)
Neutrophils Relative %: 68 %
Platelets: 292 10*3/uL (ref 150–400)
RBC: 5.11 MIL/uL (ref 4.22–5.81)
RDW: 13.1 % (ref 11.5–15.5)
WBC: 9.7 10*3/uL (ref 4.0–10.5)
nRBC: 0 % (ref 0.0–0.2)

## 2019-06-02 LAB — CBG MONITORING, ED: Glucose-Capillary: 109 mg/dL — ABNORMAL HIGH (ref 70–99)

## 2019-06-02 LAB — TROPONIN I (HIGH SENSITIVITY)
Troponin I (High Sensitivity): 10 ng/L (ref <18)
Troponin I (High Sensitivity): 13 ng/L (ref <18)

## 2019-06-02 LAB — BRAIN NATRIURETIC PEPTIDE: B Natriuretic Peptide: 75 pg/mL (ref 0.0–100.0)

## 2019-06-02 MED ORDER — LABETALOL HCL 100 MG PO TABS: 100.0000 mg | ORAL_TABLET | Freq: Two times a day (BID) | ORAL | 1 refills | Status: DC

## 2019-06-02 MED ORDER — HYDRALAZINE HCL 20 MG/ML IJ SOLN
5.0000 mg | Freq: Once | INTRAMUSCULAR | Status: AC
Start: 2019-06-02 — End: 2019-06-02
  Administered 2019-06-02: 5 mg via INTRAVENOUS
  Filled 2019-06-02: qty 1

## 2019-06-02 MED ORDER — LABETALOL HCL 200 MG PO TABS
100.0000 mg | ORAL_TABLET | Freq: Once | ORAL | Status: AC
  Administered 2019-06-02: 100 mg via ORAL
  Filled 2019-06-02: qty 1

## 2019-06-02 MED ORDER — LABETALOL HCL 5 MG/ML IV SOLN
40.0000 mg | Freq: Once | INTRAVENOUS | Status: AC
  Administered 2019-06-02: 20 mg via INTRAVENOUS
  Filled 2019-06-02: qty 8

## 2019-06-02 MED ORDER — IOHEXOL 350 MG/ML SOLN
100.0000 mL | Freq: Once | INTRAVENOUS | Status: AC | PRN
  Administered 2019-06-02: 100 mL via INTRAVENOUS

## 2019-06-02 NOTE — ED Triage Notes (Addendum)
Pt was mowing grass today and came inside due to not feeling well BP checked 260/150. Pt is deaf. Denies HA, CP or dizziness. We attempted to use interpreter but pt not able to sign back.  Seems to read lips

## 2019-06-02 NOTE — ED Provider Notes (Signed)
[facility] EMERGENCY DEPARTMENT Provider Note   CSN: [id number] Arrival date & time: 06/02/19  1759     History   Chief Complaint Chief Complaint  Patient presents with  . Hypertension    HPI Joseph Hansen is a 63 y.o. male.     Patient complains of some weakness and some chest pain today.  His blood pressure was noted to be extremely high.  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 No language interpreter was used.  Hypertension This is a new problem. The current episode started less than 1 hour ago. The problem occurs constantly. The problem has not changed since onset.Associated symptoms include chest pain. Pertinent negatives include no abdominal pain and no headaches. Nothing aggravates the symptoms. Nothing relieves the symptoms. He has tried nothing for the symptoms.    Past Medical History:  Diagnosis Date  . Deaf   . Hypertension     There are no active problems to display for this patient.   Past Surgical History:  Procedure Laterality Date  . CHOLECYSTECTOMY          Home Medications    Prior to Admission medications   Medication Sig Start Date End Date Taking? Authorizing Provider  labetalol (NORMODYNE) 100 MG tablet Take 1 tablet (100 mg total) by mouth 2 (two) times daily. 06/02/19   Milton Ferguson, MD    Family History No family history on file.  Social History Social History   Tobacco Use  . Smoking status: Never Smoker  Substance Use Topics  . Alcohol use: No  . Drug use: No     Allergies   Patient has no known allergies.   Review of Systems Review of Systems  Constitutional: Negative for appetite change and fatigue.  HENT: Negative for congestion, ear discharge and sinus pressure.   Eyes: Negative for discharge.  Respiratory: Negative for cough.   Cardiovascular: Positive for chest pain.  Gastrointestinal: Negative for abdominal pain and diarrhea.   Genitourinary: Negative for frequency and hematuria.  Musculoskeletal: Negative for back pain.  Skin: Negative for rash.  Neurological: Negative for seizures and headaches.  Psychiatric/Behavioral: Negative for hallucinations.     Physical Exam Updated Vital Signs BP (!) 171/114   Pulse 72   Temp 99.2 F (37.3 C) (Oral)   Resp 18   SpO2 96%   Physical Exam Vitals signs and nursing note reviewed.  Constitutional:      Appearance: He is well-developed.  HENT:     Head: Normocephalic.     Nose: Nose normal.  Eyes:     General: No scleral icterus.    Conjunctiva/sclera: Conjunctivae normal.  Neck:     Musculoskeletal: Neck supple.     Thyroid: No thyromegaly.  Cardiovascular:     Rate and Rhythm: Normal rate and regular rhythm.     Heart sounds: No murmur. No friction rub. No gallop.   Pulmonary:     Breath sounds: No stridor. No wheezing or rales.  Chest:     Chest wall: No tenderness.  Abdominal:     General: There is no distension.     Tenderness: There is no abdominal tenderness. There is no rebound.  Musculoskeletal: Normal range of motion.  Lymphadenopathy:     Cervical: No cervical adenopathy.  Skin:    Findings: No erythema or rash.  Neurological:     Mental Status: He is oriented to person, place, and  time.     Motor: No abnormal muscle tone.     Coordination: Coordination normal.  Psychiatric:        Behavior: Behavior normal.      ED Treatments / Results  Labs (all labs ordered are listed, but only abnormal results are displayed) Labs Reviewed  COMPREHENSIVE METABOLIC PANEL - Abnormal; Notable for the following components:      Result Value   Creatinine, Ser 1.27 (*)    Total Protein 8.4 (*)    GFR calc non Af Amer 59 (*)    All other components within normal limits  CBG MONITORING, ED - Abnormal; Notable for the following components:   Glucose-Capillary 109 (*)    All other components within normal limits  CBC WITH DIFFERENTIAL/PLATELET   BRAIN NATRIURETIC PEPTIDE  TROPONIN I (HIGH SENSITIVITY)  TROPONIN I (HIGH SENSITIVITY)    EKG EKG Interpretation  Date/Time:  Tuesday June 02 2019 18:24:17 EDT Ventricular Rate:  79 PR Interval:    QRS Duration: 105 QT Interval:  361 QTC Calculation: 414 R Axis:   21 Text Interpretation:  Sinus rhythm Left atrial enlargement LVH with secondary repolarization abnormality Anterior ST elevation, probably due to LVH Confirmed by Milton Ferguson [phone]) on 06/02/2019 9:40:59 PM   Radiology Dg Chest Portable 1 View  Result Date: 06/02/2019 CLINICAL DATA:  Hypertension. EXAM: PORTABLE CHEST 1 VIEW COMPARISON:  Single-view of the chest 05/08/2015. FINDINGS: The lungs are clear. Heart size is normal. The aorta is tortuous. No pneumothorax or pleural fluid. No acute or focal bony abnormality. IMPRESSION: No acute disease. Electronically Signed   By: Inge Rise M.D.   On: 06/02/2019 18:54   Ct Angio Chest/abd/pel For Dissection W And/or Wo Contrast  Result Date: 06/02/2019 CLINICAL DATA:  Chest pain acute aortic syndrome suspected, hypertension 260/150 EXAM: CT ANGIOGRAPHY CHEST, ABDOMEN AND PELVIS TECHNIQUE: Multidetector CT imaging through the chest, abdomen and pelvis was performed using the standard protocol during bolus administration of intravenous contrast. Multiplanar reconstructed images and MIPs were obtained and reviewed to evaluate the vascular anatomy. CONTRAST:  165mL OMNIPAQUE IOHEXOL 350 MG/ML SOLN COMPARISON:  CT abdomen pelvis May 08, 2015, same day chest radiograph FINDINGS: CTA CHEST FINDINGS Cardiovascular: Noncontrast CT of the chest reveals are abnormal hyperdense mural thickening or plaque displacement to suggest intramural hematoma. Postcontrast images demonstrate preferential opacification of the thoracic aorta The aortic root is suboptimally assessed given cardiac pulsation artifact. Atherosclerotic plaque within the normal caliber aorta. No intramural hematoma,  dissection flap or other acute luminal abnormality of the aorta is seen. No periaortic stranding or hemorrhage. Additional atheromatous plaque is present within the proximal great vessels. Borderline cardiomegaly. Calcified atheromatous plaque within the coronary arteries. Central pulmonary arteries are normal caliber, not well opacified for luminal evaluation on this nondedicated exam. Mediastinum/Nodes: No enlarged mediastinal or axillary lymph nodes. Thyroid gland, trachea, and esophagus demonstrate no significant findings. Lungs/Pleura: Dependent atelectasis posteriorly. No consolidation, features of edema, pneumothorax, or effusion. No suspicious pulmonary nodules or masses. Musculoskeletal: No acute osseous abnormality or suspicious osseous lesion. No concerning chest wall lesions. Mild left gynecomastia. Review of the MIP images confirms the above findings. CTA ABDOMEN AND PELVIS FINDINGS VASCULAR Aorta: Proximal abdominal aorta is normal caliber with atheromatous plaque. 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 Small amount of eccentric noncalcified mural plaque is present within the ectatic segment. Celiac: Patent without evidence  of aneurysm, dissection, vasculitis or significant stenosis. SMA: Patent without evidence of aneurysm, dissection, vasculitis or significant stenosis. Renals: Single renal arteries bilaterally. No aneurysm, dissection or vasculitis or CT features of fibromuscular dysplasia. IMA: IMA arises from the anterior aspect of the fusiform ectatic aorta detailed above. Small channel is present within the mural plaque the supply the IMA origin with only mild-to-moderate resulting stenosis. No dissection, vasculitis or other significant stenosis. Inflow: Calcified noncalcified plaque is present within the proximal inflow vessels. No flow-limiting stenosis, aneurysm, dissection  or features of vasculitis. Veins: No obvious venous abnormality within the limitations of this arterial phase study. Review of the MIP images confirms the above findings. NON-VASCULAR Hepatobiliary: No focal liver abnormality is seen. Patient is post cholecystectomy. Slight prominence of the biliary tree likely related to reservoir effect. No calcified intraductal gallstones. Pancreas: Unremarkable. No pancreatic ductal dilatation or surrounding inflammatory changes. Spleen: Normal in size without focal abnormality. Adrenals/Urinary Tract: Adrenal glands are unremarkable. Kidneys are normal, without renal calculi, focal lesion, or hydronephrosis. Bladder is unremarkable. Stomach/Bowel: Distal esophagus, stomach and duodenal sweep are unremarkable. No bowel wall thickening or dilatation. No evidence of obstruction. A normal appendix is visualized. No colonic dilatation or wall thickening. Lymphatic: No suspicious or enlarged lymph nodes in the included lymphatic chains. Reproductive: Borderline prostatomegaly. Seminal vesicles are unremarkable. Other: Trace low-attenuation free fluid is seen in the pelvis (5/184). No free air. No organized collection or abscess formation. No bowel containing hernias. Mild posterior body wall edema. Musculoskeletal: Multilevel degenerative changes are present in the imaged portions of the spine. Findings are maximal at L4-5 and L5-S1. No acute osseous abnormality or suspicious osseous lesion. Review of the MIP images confirms the above findings. IMPRESSION: 1. No evidence of acute aortic syndrome. No acute intrathoracic process. 2. 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 Ectatic abdominal aorta at risk for aneurysm development. Recommend followup by ultrasound in 5 years. This recommendation follows ACR consensus guidelines: White Paper of the ACR Incidental Findings Committee II on Vascular Findings. J Am Coll Radiol 2013;  10:789-794. Aortic aneurysm NOS (ICD10-I71.9) 3. 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 4. Trace low-attenuation free fluid in the pelvis, nonspecific. Correlate with clinical exam findings and patient hydration status. Such fluid in the male pelvis is a normal finding in up to 5% of patients. Electronically Signed   By: Lovena Le M.D.   On: 06/02/2019 20:45    Procedures Procedures (including critical care time)  Medications Ordered in ED Medications  hydrALAZINE (APRESOLINE) injection 5 mg (5 mg Intravenous Given 06/02/19 1843)  iohexol (OMNIPAQUE) 350 MG/ML injection 100 mL (100 mLs Intravenous Contrast Given 06/02/19 1954)  labetalol (NORMODYNE) injection 40 mg (20 mg Intravenous Given 06/02/19 2116)  labetalol (NORMODYNE) tablet 100 mg (100 mg Oral Given 06/02/19 2152)     Initial Impression / Assessment and Plan / ED Course  I have reviewed the triage vital signs and the nursing notes.  Pertinent labs & imaging results that were available during my care of the patient were reviewed by me and considered in my medical decision making (see chart for details).        CRITICAL CARE Performed by: Milton Ferguson Total critical care time: 45 minutes Critical care time was exclusive of separately billable procedures and treating other patients. Critical care was necessary to treat or prevent imminent or life-threatening deterioration. Critical care  was time spent personally by me on the following activities: development of treatment plan with patient and/or surrogate as well as nursing, discussions with consultants, evaluation of patient's response to treatment, examination of patient, obtaining history from patient or surrogate, ordering and performing treatments and interventions, ordering and review of laboratory studies, ordering and review of radiographic studies, pulse oximetry and re-evaluation of patient's  condition. Labs and CT scan unremarkable.  Patient's blood pressure improved with IV labetalol and hydralazine.  He will be sent home on labetalol and will follow-up as an outpatient  Final Clinical Impressions(s) / ED Diagnoses   Final diagnoses:  Essential hypertension    ED Discharge Orders         Ordered    labetalol (NORMODYNE) 100 MG tablet  2 times daily     06/02/19 2153           Milton Ferguson, MD 06/02/19 2202

## 2019-06-02 NOTE — ED Notes (Signed)
Pt returned from CT °

## 2019-06-02 NOTE — ED Notes (Signed)
Pt's sister Baker Janus # [phone]

## 2019-06-02 NOTE — ED Notes (Signed)
Pt transported to CT ?

## 2019-06-02 NOTE — Discharge Instructions (Signed)
Follow up with a family md or follow up with Dr. Harl Bowie a cardiologist in one week.  Call for an appointment

## 2019-06-05 ENCOUNTER — Other Ambulatory Visit: Payer: Self-pay

## 2019-06-05 ENCOUNTER — Emergency Department (HOSPITAL_COMMUNITY)
Admission: EM | Admit: 2019-06-05 | Discharge: 2019-06-06 | Disposition: A | Discharge status: Home / Self Care | Payer: Medicare Other | Attending: Emergency Medicine | Admitting: Emergency Medicine

## 2019-06-05 ENCOUNTER — Encounter (HOSPITAL_COMMUNITY): Payer: Self-pay

## 2019-06-05 DIAGNOSIS — I10 Essential (primary) hypertension: Secondary | ICD-10-CM | POA: Diagnosis not present

## 2019-06-05 MED ORDER — CLONIDINE HCL 0.1 MG PO TABS
0.1000 mg | ORAL_TABLET | Freq: Once | ORAL | Status: AC
  Administered 2019-06-05: 0.1 mg via ORAL
  Filled 2019-06-05: qty 1

## 2019-06-05 NOTE — ED Triage Notes (Signed)
Pt from home vis [facility] for hypertension 240/150 per [facility].  Pt takes bp med twice a day.  Pt denies complaints

## 2019-06-06 NOTE — Discharge Instructions (Addendum)
Keep your appointment on Monday, September 21. Make sure he takes his medication twice a day.

## 2019-06-06 NOTE — ED Provider Notes (Signed)
[facility] EMERGENCY DEPARTMENT Provider Note   CSN: [id number] Arrival date & time: 06/05/19  2240   Time seen 11:16 PM  History   Chief Complaint Chief Complaint  Patient presents with  . Hypertension   Level 5 caveat for being deaf and does not lip read or do sign  HPI Joseph Hansen is a 63 y.o. male.     HPI per patient sister who states only the family is able to communicate with patient patient has a history of high blood pressure and did not even finish the first prescription he was given for it about 5 years ago.  She states he has not seen a doctor in years.  They state he was "staggering" Tuesday and his sister checked his blood pressure and it was very high.  He was brought to the emergency department on September 15 and was started on blood pressure medication which she states he is taking.  He last took it at 7 PM.  He has an appointment on Monday, September 21 with Dr. Berdine Addison for his first visit.  He denies headache, chest pain, shortness of breath, urinary problems, nausea, vomiting, or diaphoresis.  Sister states he was living with his mother however she died 3 years ago.  He was living alone.  Recently they noted he had difficulty doing minor daily tasks such as turning on the microwave or turning on the TV and his other sister moved in with him September 1.  She states he does not have a legal guardian.  PCP Dr French Ana   Past Medical History:  Diagnosis Date  . Deaf   . Hypertension     There are no active problems to display for this patient.   Past Surgical History:  Procedure Laterality Date  . CHOLECYSTECTOMY          Home Medications    Prior to Admission medications   Medication Sig Start Date End Date Taking? Authorizing Provider  labetalol (NORMODYNE) 100 MG tablet Take 1 tablet (100 mg total) by mouth 2 (two) times daily. 06/02/19   Milton Ferguson, MD    Family History No family history on file.  Social History Social History    Tobacco Use  . Smoking status: Never Smoker  . Smokeless tobacco: Never Used  Substance Use Topics  . Alcohol use: No  . Drug use: No  lives at home] Sister lives with him   Allergies   Patient has no known allergies.   Review of Systems Review of Systems  All other systems reviewed and are negative.    Physical Exam Updated Vital Signs BP (!) 192/114   Pulse 62   Temp 98.3 F (36.8 C) (Oral)   Resp 17   Ht 5\' 9"  (1.753 m)   Wt 82.7 kg   SpO2 99%   BMI 26.92 kg/m   Physical Exam Vitals signs and nursing note reviewed.  Constitutional:      General: He is not in acute distress.    Appearance: Normal appearance. He is well-developed. He is not ill-appearing or toxic-appearing.  HENT:     Head: Normocephalic and atraumatic.     Right Ear: External ear normal.     Left Ear: External ear normal.     Nose: Nose normal. No mucosal edema or rhinorrhea.     Mouth/Throat:     Dentition: No dental abscesses.     Pharynx: No uvula swelling.  Eyes:     Extraocular Movements: Extraocular movements  intact.     Conjunctiva/sclera: Conjunctivae normal.     Pupils: Pupils are equal, round, and reactive to light.  Neck:     Musculoskeletal: Full passive range of motion without pain, normal range of motion and neck supple.  Cardiovascular:     Rate and Rhythm: Normal rate and regular rhythm.     Heart sounds: Normal heart sounds. No murmur. No friction rub. No gallop.   Pulmonary:     Effort: Pulmonary effort is normal. No respiratory distress.     Breath sounds: Normal breath sounds. No wheezing, rhonchi or rales.  Chest:     Chest wall: No tenderness or crepitus.  Abdominal:     General: Bowel sounds are normal. There is no distension.     Palpations: Abdomen is soft.     Tenderness: There is no abdominal tenderness. There is no guarding or rebound.  Musculoskeletal: Normal range of motion.        General: No tenderness.     Comments: Moves all extremities well.    Skin:    General: Skin is warm and dry.     Coloration: Skin is not pale.     Findings: No erythema or rash.  Neurological:     General: No focal deficit present.     Mental Status: He is alert and oriented to person, place, and time.     Cranial Nerves: No cranial nerve deficit.  Psychiatric:        Mood and Affect: Mood normal. Mood is not anxious.        Speech: Speech normal.        Behavior: Behavior normal.        Thought Content: Thought content normal.      ED Treatments / Results  Labs (all labs ordered are listed, but only abnormal results are displayed) Labs Reviewed - No data to display  EKG None  Radiology No results found.  Procedures Procedures (including critical care time)  Medications Ordered in ED Medications  cloNIDine (CATAPRES) tablet 0.1 mg (0.1 mg Oral Given 06/05/19 2347)     Initial Impression / Assessment and Plan / ED Course  I have reviewed the triage vital signs and the nursing notes.  Pertinent labs & imaging results that were available during my care of the patient were reviewed by me and considered in my medical decision making (see chart for details).        Blood pressure at time of my exam was 179/110, he was given clonidine orally.  He had been prescribed labetalol orally. Review of his chart shows he had blood work done 3 days ago.  He has some mild renal insufficiency that has been persistent through prior blood work ED blood work up to 4 years ago.  1:10 AM patient's blood pressure is 156/96 he feels fine, he was discharged home.  His sister was reassured it can take several days for the blood pressure medication to stabilize the hypertension.  Final Clinical Impressions(s) / ED Diagnoses   Final diagnoses:  Essential hypertension    ED Discharge Orders    None     Plan discharge  Rolland Porter, MD, Barbette Or, MD 06/06/19 [phone]

## 2019-06-06 NOTE — ED Notes (Signed)
IV  Access to right forearm was removed- cath intact- bandaid applied

## 2019-06-20 NOTE — Progress Notes (Signed)
CARDIOLOGY CONSULT NOTE       Patient ID: Joseph Hansen MRN: [id number] DOB/AGE: 1955/09/25 63 y.o.  Admit date: (Not on file) Referring Physician: [facility] Primary Physician: Iona Beard, MD Primary Cardiologist: New Reason for Consultation: HTN  Active Problems:   * No active hospital problems. 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 History of HTN but not compliant with meds. Sister brought him to ER for high BP and "staggering"  Supposed to see Dr Berdine Addison as new primary Poor home situation as he use to live with mother but she died 3 years ago.  Sister moved in with him September  He was Rx with clonidine in ER and prescribed labetalol CT done in ER 06/02/19 ? For chest pain was negative for dissection ectasia of abdominal aorta only 2.8 cm CXR NAD   I pulled my mask down and he seemed to understand me better Sister helped communicate. Denies any current chest pain dyspnea or palpitations Did not take his labetalol today Has not seen a doctor in years before ER visit As he did not like Dr Criss Rosales    ROS All other systems reviewed and negative except as noted above  Past Medical History:  Diagnosis Date  . Deaf   . Hypertension     Family History  Problem Relation Age of Onset  . Chronic Renal Failure Mother   . Atrial fibrillation Mother   . Diabetes Father   . Heart attack Father     Social History   Socioeconomic History  . Marital status: Single    Spouse name: Not on file  . Number of children: Not on file  . Years of education: Not on file  . Highest education level: Not on file  Occupational History  . Not on file  Social Needs  . Financial resource strain: Not on file  . Food insecurity    Worry: Not on file    Inability: Not on file  . Transportation needs    Medical: Not on file    Non-medical: Not on file  Tobacco Use  . Smoking status: Never Smoker  . Smokeless tobacco:  Never Used  Substance and Sexual Activity  . Alcohol use: No  . Drug use: No  . Sexual activity: Never  Lifestyle  . Physical activity    Days per week: Not on file    Minutes per session: Not on file  . Stress: Not on file  Relationships  . Social Herbalist on phone: Not on file    Gets together: Not on file    Attends religious service: Not on file    Active member of club or organization: Not on file    Attends meetings of clubs or organizations: Not on file    Relationship status: Not on file  . Intimate partner violence    Fear of current or ex partner: Not on file    Emotionally abused: Not on file    Physically abused: Not on file    Forced sexual activity: Not on file  Other Topics Concern  . Not on file  Social History Narrative  . Not on file    Past Surgical History:  Procedure Laterality Date  . CHOLECYSTECTOMY          Physical Exam: Blood pressure (!) 207/102, pulse (!) 52, temperature (!) 97.5 F (36.4  C), temperature source Temporal, height 5\' 10"  (1.778 m), weight 166 lb (75.3 kg), SpO2 97 %.    Affect appropriate Healthy:  appears stated age 63: Deaf Neck supple with no adenopathy JVP normal no bruits no thyromegaly Lungs clear with no wheezing and good diaphragmatic motion Heart:  S1/S2 no murmur, no rub, gallop or click PMI normal Abdomen: benighn, BS positve, no tenderness, no AAA post cholecystectomy  no bruit.  No HSM or HJR Distal pulses intact with no bruits No edema Neuro non-focal Skin warm and dry No muscular weakness   Labs:   Lab Results  Component Value Date   WBC 9.7 06/02/2019   HGB 13.9 06/02/2019   HCT 44.7 06/02/2019   MCV 87.5 06/02/2019   PLT 292 06/02/2019   No results for input(s): NA, K, CL, CO2, BUN, CREATININE, CALCIUM, PROT, BILITOT, ALKPHOS, ALT, AST, GLUCOSE in the last 168 hours.  Invalid input(s): LABALBU Lab Results  Component Value Date   CKTOTAL 76 10/17/2010   CKMB 1.8 10/17/2010    TROPONINI 0.01        NO INDICATION OF MYOCARDIAL INJURY. 10/17/2010   Radiology: Dg Chest Portable 1 View  Result Date: 06/02/2019 CLINICAL DATA:  Hypertension. EXAM: PORTABLE CHEST 1 VIEW COMPARISON:  Single-view of the chest 05/08/2015. FINDINGS: The lungs are clear. Heart size is normal. The aorta is tortuous. No pneumothorax or pleural fluid. No acute or focal bony abnormality. IMPRESSION: No acute disease. Electronically Signed   By: Inge Rise M.D.   On: 06/02/2019 18:54   Ct Angio Chest/abd/pel For Dissection W And/or Wo Contrast  Result Date: 06/02/2019 CLINICAL DATA:  Chest pain acute aortic syndrome suspected, hypertension 260/150 EXAM: CT ANGIOGRAPHY CHEST, ABDOMEN AND PELVIS TECHNIQUE: Multidetector CT imaging through the chest, abdomen and pelvis was performed using the standard protocol during bolus administration of intravenous contrast. Multiplanar reconstructed images and MIPs were obtained and reviewed to evaluate the vascular anatomy. CONTRAST:  177mL OMNIPAQUE IOHEXOL 350 MG/ML SOLN COMPARISON:  CT abdomen pelvis May 08, 2015, same day chest radiograph FINDINGS: CTA CHEST FINDINGS Cardiovascular: Noncontrast CT of the chest reveals are abnormal hyperdense mural thickening or plaque displacement to suggest intramural hematoma. Postcontrast images demonstrate preferential opacification of the thoracic aorta The aortic root is suboptimally assessed given cardiac pulsation artifact. Atherosclerotic plaque within the normal caliber aorta. No intramural hematoma, dissection flap or other acute luminal abnormality of the aorta is seen. No periaortic stranding or hemorrhage. Additional atheromatous plaque is present within the proximal great vessels. Borderline cardiomegaly. Calcified atheromatous plaque within the coronary arteries. Central pulmonary arteries are normal caliber, not well opacified for luminal evaluation on this nondedicated exam. Mediastinum/Nodes: No enlarged  mediastinal or axillary lymph nodes. Thyroid gland, trachea, and esophagus demonstrate no significant findings. Lungs/Pleura: Dependent atelectasis posteriorly. No consolidation, features of edema, pneumothorax, or effusion. No suspicious pulmonary nodules or masses. Musculoskeletal: No acute osseous abnormality or suspicious osseous lesion. No concerning chest wall lesions. Mild left gynecomastia. Review of the MIP images confirms the above findings. CTA ABDOMEN AND PELVIS FINDINGS VASCULAR Aorta: Proximal abdominal aorta is normal caliber with atheromatous plaque. 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 Small amount of eccentric noncalcified mural plaque is present within the ectatic segment. Celiac: Patent without evidence of aneurysm, dissection, vasculitis or significant stenosis. SMA: Patent without evidence of aneurysm, dissection, vasculitis or significant stenosis. Renals: Single renal  arteries bilaterally. No aneurysm, dissection or vasculitis or CT features of fibromuscular dysplasia. IMA: IMA arises from the anterior aspect of the fusiform ectatic aorta detailed above. Small channel is present within the mural plaque the supply the IMA origin with only mild-to-moderate resulting stenosis. No dissection, vasculitis or other significant stenosis. Inflow: Calcified noncalcified plaque is present within the proximal inflow vessels. No flow-limiting stenosis, aneurysm, dissection or features of vasculitis. Veins: No obvious venous abnormality within the limitations of this arterial phase study. Review of the MIP images confirms the above findings. NON-VASCULAR Hepatobiliary: No focal liver abnormality is seen. Patient is post cholecystectomy. Slight prominence of the biliary tree likely related to reservoir effect. No calcified intraductal gallstones. Pancreas: Unremarkable. No pancreatic  ductal dilatation or surrounding inflammatory changes. Spleen: Normal in size without focal abnormality. Adrenals/Urinary Tract: Adrenal glands are unremarkable. Kidneys are normal, without renal calculi, focal lesion, or hydronephrosis. Bladder is unremarkable. Stomach/Bowel: Distal esophagus, stomach and duodenal sweep are unremarkable. No bowel wall thickening or dilatation. No evidence of obstruction. A normal appendix is visualized. No colonic dilatation or wall thickening. Lymphatic: No suspicious or enlarged lymph nodes in the included lymphatic chains. Reproductive: Borderline prostatomegaly. Seminal vesicles are unremarkable. Other: Trace low-attenuation free fluid is seen in the pelvis (5/184). No free air. No organized collection or abscess formation. No bowel containing hernias. Mild posterior body wall edema. Musculoskeletal: Multilevel degenerative changes are present in the imaged portions of the spine. Findings are maximal at L4-5 and L5-S1. No acute osseous abnormality or suspicious osseous lesion. Review of the MIP images confirms the above findings. IMPRESSION: 1. No evidence of acute aortic syndrome. No acute intrathoracic process. 2. 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 Ectatic abdominal aorta at risk for aneurysm development. Recommend followup by ultrasound in 5 years. This recommendation follows ACR consensus guidelines: White Paper of the ACR Incidental Findings Committee II on Vascular Findings. J Am Coll Radiol 2013; 10:789-794. Aortic aneurysm NOS (ICD10-I71.9) 3. 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 4. Trace low-attenuation free fluid in the pelvis, nonspecific. Correlate with clinical exam findings and patient hydration status. Such fluid in the male pelvis is a normal finding in up to 5% of patients. Electronically Signed   By: Lovena Le M.D.   On:  06/02/2019 20:45    EKG: 06/03/19 SR LVH with strain LAE   ASSESSMENT AND PLAN:   1. HTN:  D/c from ER on labetalol Needs refill and more medication Start cozaar 50 mg to be taken in between doses of beta blocker He will f/u with Dr Berdine Addison for further Rx of this Also will arrange for sister To get free blood pressure cuff through our office as hers is broken will check echo to make sure there is not severe LVH or low EF since he has had long standing unRx HTN   Signed: Jenkins Rouge 06/23/2019, 3:39 PM

## 2019-06-23 ENCOUNTER — Other Ambulatory Visit: Payer: Self-pay

## 2019-06-23 ENCOUNTER — Ambulatory Visit (INDEPENDENT_AMBULATORY_CARE_PROVIDER_SITE_OTHER): Payer: Medicare Other | Admitting: Cardiovascular Disease

## 2019-06-23 ENCOUNTER — Encounter: Payer: Self-pay | Admitting: Cardiovascular Disease

## 2019-06-23 VITALS — BP 207/102 | HR 52 | Temp 97.5°F | Ht 70.0 in | Wt 166.0 lb

## 2019-06-23 DIAGNOSIS — I10 Essential (primary) hypertension: Secondary | ICD-10-CM

## 2019-06-23 MED ORDER — LOSARTAN POTASSIUM 50 MG PO TABS: 50.0000 mg | ORAL_TABLET | Freq: Every day | ORAL | 3 refills | Status: DC

## 2019-06-23 MED ORDER — LABETALOL HCL 100 MG PO TABS: 100.0000 mg | ORAL_TABLET | Freq: Two times a day (BID) | ORAL | 3 refills | Status: AC

## 2019-06-23 NOTE — Patient Instructions (Signed)
Medication Instructions:  START COZAAR 50 MG IN THE EVENING   TAKE LABETALOL MORNING/EARLY AFTERNOON   Labwork: NONE  Testing/Procedures: NONE  Follow-Up: Your physician recommends that you schedule a follow-up appointment in: AS NEEDED    Any Other Special Instructions Will Be Listed Below (If Applicable).     If you need a refill on your cardiac medications before your next appointment, please call your pharmacy.

## 2019-06-24 ENCOUNTER — Telehealth: Payer: Self-pay | Admitting: Licensed Clinical Social Worker

## 2019-06-24 NOTE — Telephone Encounter (Signed)
CSW referred to assist patient with obtaining a BP cuff. CSW contacted patient to inform cuff will be delivered to home. Patient grateful for support and assistance. CSW available as needed. Jackie Boden Stucky, LCSW, CCSW-MCS [phone]  

## 2020-05-30 ENCOUNTER — Other Ambulatory Visit: Payer: Self-pay

## 2020-05-30 ENCOUNTER — Ambulatory Visit
Admission: EM | Admit: 2020-05-30 | Discharge: 2020-05-30 | Disposition: A | Discharge status: Home / Self Care | Payer: Medicare Other | Attending: Emergency Medicine | Admitting: Emergency Medicine

## 2020-05-30 DIAGNOSIS — Z1152 Encounter for screening for COVID-19: Secondary | ICD-10-CM

## 2020-05-30 NOTE — ED Triage Notes (Signed)
covid exposure ---- no symptoms  

## 2020-06-01 LAB — NOVEL CORONAVIRUS, NAA: SARS-CoV-2, NAA: DETECTED — AB

## 2020-06-01 LAB — SARS-COV-2, NAA 2 DAY TAT

## 2020-06-08 ENCOUNTER — Ambulatory Visit
Admission: EM | Admit: 2020-06-08 | Discharge: 2020-06-08 | Discharge status: Absent without leave | Payer: Medicare Other

## 2020-09-21 ENCOUNTER — Other Ambulatory Visit: Payer: Self-pay | Admitting: Cardiovascular Disease

## 2020-10-10 ENCOUNTER — Ambulatory Visit: Payer: Medicare Other | Admitting: Urology

## 2020-10-19 ENCOUNTER — Other Ambulatory Visit: Payer: Self-pay

## 2020-10-19 ENCOUNTER — Ambulatory Visit (INDEPENDENT_AMBULATORY_CARE_PROVIDER_SITE_OTHER): Payer: Medicare Other | Admitting: Urology

## 2020-10-19 ENCOUNTER — Encounter: Payer: Self-pay | Admitting: Urology

## 2020-10-19 VITALS — BP 153/98 | HR 66 | Temp 97.8°F | Ht 68.0 in | Wt 171.2 lb

## 2020-10-19 DIAGNOSIS — R972 Elevated prostate specific antigen [PSA]: Secondary | ICD-10-CM

## 2020-10-19 DIAGNOSIS — R300 Dysuria: Secondary | ICD-10-CM

## 2020-10-19 LAB — URINALYSIS, ROUTINE W REFLEX MICROSCOPIC
Bilirubin, UA: NEGATIVE
Glucose, UA: NEGATIVE
Ketones, UA: NEGATIVE
Leukocytes,UA: NEGATIVE
Nitrite, UA: NEGATIVE
Protein,UA: NEGATIVE
RBC, UA: NEGATIVE
Specific Gravity, UA: <1.005 — ABNORMAL LOW (ref 1.005–1.030)
Urobilinogen, Ur: 0.2 mg/dL (ref 0.2–1.0)
pH, UA: 5 (ref 5.0–7.5)

## 2020-10-19 NOTE — Patient Instructions (Signed)
  Prostate-Specific Antigen Test Why am I having this test? The prostate-specific antigen (PSA) test is a screening test for prostate cancer. It can identify early signs of prostate cancer, which may allow for more effective treatment. Your health care provider may recommend that you have a PSA test starting at age 67 or that you have one earlier or later, depending on your risk factors for prostate cancer. You may also have a PSA test:  To monitor treatment of prostate cancer.  To check whether prostate cancer has returned after treatment.  If you have signs of other conditions that can affect PSA levels, such as: ? An enlarged prostate that is not caused by cancer (benign prostatic hyperplasia, BPH). This condition is very common in older men. ? A prostate infection. What is being tested? This test measures the amount of PSA in your blood. PSA is a protein that is made in the prostate. The prostate naturally produces more PSA as you age, but very high levels may be a sign of a medical condition. What kind of sample is taken? A blood sample is required for this test. It is usually collected by inserting a needle into a blood vessel or by sticking a finger with a small needle. Blood for this test should be drawn before having an exam of the prostate.   How do I prepare for this test? Do not ejaculate starting 24 hours before your test, or as long as told by your health care provider. Tell a health care provider about:  Any allergies you have.  All medicines you are taking, including vitamins, herbs, eye drops, creams, and over-the-counter medicines. This also includes: ? Medicines to assist with hair growth, such as finasteride. ? Any recent exposure to a medicine called diethylstilbestrol.  Any blood disorders you have.  Any recent procedures you have had, especially any procedures involving the prostate or rectum.  Any medical conditions you have.  Any recent urinary tract  infections (UTIs) you have had. How are the results reported? Your test results will be reported as a value that indicates how much PSA is in your blood. This will be given as nanograms of PSA per milliliter of blood (ng/mL). Your health care provider will compare your results to normal ranges that were established after testing a large group of people (reference ranges). Reference ranges may vary among labs and hospitals. PSA levels vary from person to person and generally increase with age. Because of this variation, there is no single PSA value that is considered normal for everyone. Instead, PSA reference ranges are used to describe whether your PSA levels are considered low or high (elevated). Common reference ranges are:  Low: 0-2.5 ng/mL.  Slightly to moderately elevated: 2.6-10.0 ng/mL.  Moderately elevated: 10.0-19.9 ng/mL.  Significantly elevated: 20 ng/mL or greater. Sometimes, the test results may report that a condition is present when it is not present (false-positive result). What do the results mean? A test result that is higher than 4 ng/mL may mean that you are at an increased risk for prostate cancer. However, a PSA test by itself is not enough to diagnose prostate cancer. High PSA levels may also be caused by the natural aging process, prostate infection, or BPH. PSA screening cannot tell you if your PSA is high due to cancer or a different cause. A prostate biopsy is the only way to diagnose prostate cancer. A risk of having the PSA test is diagnosing and treating prostate cancer that would never   have caused any symptoms or problems (overdiagnosis and overtreatment). Talk with your health care provider about what your results mean. Questions to ask your health care provider Ask your health care provider, or the department that is doing the test:  When will my results be ready?  How will I get my results?  What are my treatment options?  What other tests do I  need?  What are my next steps? Summary  The prostate-specific antigen (PSA) test is a screening test for prostate cancer.  Your health care provider may recommend that you have a PSA test starting at age 67 or that you have one earlier or later, depending on your risk factors for prostate cancer.  A test result that is higher than 4 ng/mL may mean that you are at an increased risk for prostate cancer. However, elevated levels can be caused by a number of conditions other than prostate cancer.  Talk with your health care provider about what your results mean. This information is not intended to replace advice given to you by your health care provider. Make sure you discuss any questions you have with your health care provider. Document Revised: 05/19/2020 Document Reviewed: 05/19/2020 Elsevier Patient Education  2021 Elsevier Inc.  

## 2020-10-19 NOTE — Progress Notes (Signed)

## 2020-10-19 NOTE — Progress Notes (Signed)
10/19/2020 9:23 AM   Joseph Hansen Appl November 17, 1955 [id number]  Referring provider: Iona Beard, MD [address] [place],  [place] [address]  Elevated PSA  HPI: Joseph Hansen is a [phone] here for evaluation of elevated PSA> PSA was 55.3 in 07/2020. He has nocturia 2-3x. No other significant LUTS. The patient is deaf. No hematuria. No hx of UTI. No family hx of prostate cancer.    PMH: Past Medical History:  Diagnosis Date  . Deaf   . Hypertension     Surgical History: Past Surgical History:  Procedure Laterality Date  . CHOLECYSTECTOMY      Home Medications:  Allergies as of 10/19/2020   No Known Allergies     Medication List       Accurate as of October 19, 2020  9:23 AM. If you have any questions, ask your nurse or doctor.        atorvastatin 20 MG tablet Commonly known as: LIPITOR Take 20 mg by mouth at bedtime.   labetalol 100 MG tablet Commonly known as: NORMODYNE Take 1 tablet (100 mg total) by mouth 2 (two) times daily.   losartan 100 MG tablet Commonly known as: COZAAR Take by mouth. What changed: Another medication with the same name was removed. Continue taking this medication, and follow the directions you see here. Changed by: Joseph Bang, MD       Allergies: No Known Allergies  Family History: Family History  Problem Relation Age of Onset  . Chronic Renal Failure Mother   . Atrial fibrillation Mother   . Diabetes Father   . Heart attack Father     Social History:  reports that he has never smoked. He has never used smokeless tobacco. He reports that he does not drink alcohol and does not use drugs.  ROS: All other review of systems were reviewed and are negative except what is noted above in HPI  Physical Exam: BP (!) 153/98   Pulse 66   Temp 97.8 F (36.6 C)   Constitutional:  Alert and oriented, No acute distress. HEENT:  AT, moist mucus membranes.  Trachea midline, no masses. Cardiovascular: No clubbing, cyanosis, or  edema. Respiratory: Normal respiratory effort, no increased work of breathing. GI: Abdomen is soft, nontender, nondistended, no abdominal masses GU: No CVA tenderness.  Lymph: No cervical or inguinal lymphadenopathy. Skin: No rashes, bruises or suspicious lesions. Neurologic: Grossly intact, no focal deficits, moving all 4 extremities. Psychiatric: Normal mood and affect.  Laboratory Data: Lab Results  Component Value Date   WBC 9.7 06/02/2019   HGB 13.9 06/02/2019   HCT 44.7 06/02/2019   MCV 87.5 06/02/2019   PLT 292 06/02/2019    Lab Results  Component Value Date   CREATININE 1.27 (H) 06/02/2019    No results found for: PSA  No results found for: TESTOSTERONE  No results found for: HGBA1C  Urinalysis    Component Value Date/Time   COLORURINE YELLOW 05/08/2015 1730   APPEARANCEUR CLEAR 05/08/2015 1730   LABSPEC 1.020 05/08/2015 1730   PHURINE 5.5 05/08/2015 1730   GLUCOSEU NEGATIVE 05/08/2015 1730   HGBUR NEGATIVE 05/08/2015 1730   BILIRUBINUR NEGATIVE 05/08/2015 1730   KETONESUR NEGATIVE 05/08/2015 1730   PROTEINUR NEGATIVE 05/08/2015 1730   UROBILINOGEN 0.2 05/08/2015 1730   NITRITE NEGATIVE 05/08/2015 1730   LEUKOCYTESUR NEGATIVE 05/08/2015 1730    Lab Results  Component Value Date   BACTERIA MANY (A) 03/01/2007    Pertinent Imaging:  No results found  for this or any previous visit.  No results found for this or any previous visit.  No results found for this or any previous visit.  No results found for this or any previous visit.  No results found for this or any previous visit.  No results found for this or any previous visit.  No results found for this or any previous visit.  No results found for this or any previous visit.   Assessment & Plan:    1. Elevated PSA -We will recheck PSA today. If his PSa remains elevated we will proceed with prostate biopsy in the operating room.    No follow-ups on file.  Joseph Bang, MD  [facility] [place]

## 2020-10-20 LAB — PSA: Prostate Specific Ag, Serum: 75.4 ng/mL — ABNORMAL HIGH (ref 0.0–4.0)

## 2020-10-24 ENCOUNTER — Telehealth: Payer: Self-pay | Admitting: Urology

## 2020-10-24 NOTE — Telephone Encounter (Signed)
PT called wanting to know the results of his PSA level

## 2020-10-24 NOTE — Telephone Encounter (Signed)
Please call patients sister with results

## 2020-10-26 ENCOUNTER — Other Ambulatory Visit: Payer: Self-pay

## 2020-10-26 DIAGNOSIS — R972 Elevated prostate specific antigen [PSA]: Secondary | ICD-10-CM

## 2020-10-26 MED ORDER — LEVOFLOXACIN 750 MG PO TABS: 750.0000 mg | ORAL_TABLET | Freq: Every day | ORAL | 0 refills | Status: DC

## 2020-10-26 NOTE — Telephone Encounter (Signed)
Dr. Alyson Ingles sent message and [facility] was scheduling prostate biopsy and sister came into office and notified sister.

## 2020-10-26 NOTE — Telephone Encounter (Signed)
Patients sister Angelita Ingles came into office and made aware of patients PSA results and need for Prostate biopsy. Biopsy scheduled and instructions went over with sister. Sister voiced understanding. Orders placed and antibiotic sent in to pharmacy. Typed biopsy instructions given to sister.

## 2020-11-16 ENCOUNTER — Ambulatory Visit (HOSPITAL_COMMUNITY): Admission: RE | Admit: 2020-11-16 | Payer: Medicare Other | Source: Ambulatory Visit

## 2020-11-16 ENCOUNTER — Other Ambulatory Visit: Payer: Medicare Other | Admitting: Urology

## 2020-11-16 ENCOUNTER — Encounter (HOSPITAL_COMMUNITY): Payer: Self-pay

## 2020-11-18 ENCOUNTER — Ambulatory Visit: Payer: Medicare Other | Admitting: Urology

## 2020-11-30 NOTE — Patient Instructions (Signed)
Joseph Hansen  11/30/2020     @PREFPERIOPPHARMACY @   Your procedure is scheduled on Monday, 12/05/20.  Report to [facility] at 1215 P.M.  Call this number if you have problems the morning of surgery:  [phone]   Remember:  Do not eat or drink after midnight.     Take these medicines the morning of surgery with A SIP OF WATER labetolol and losartan     Do not wear jewelry, make-up or nail polish.  Do not wear lotions, powders, or perfumes, or deodorant.  Do not shave 48 hours prior to surgery.  Men may shave face and neck.  Do not bring valuables to the hospital.  [facility] is not responsible for any belongings or valuables.  Contacts, dentures or bridgework may not be worn into surgery.  Leave your suitcase in the car.  After surgery it may be brought to your room.  For patients admitted to the hospital, discharge time will be determined by your treatment team.  Patients discharged the day of surgery will not be allowed to drive home.   Name and phone number of your driver:   family Special instructions:  none  Please read over the following fact sheets that you were given. Coughing and Deep Breathing, Anesthesia Post-op Instructions and Care and Recovery After Surgery      [facility] (11th ed., pp. [phone]). [place], [place]: Elsevier.">  Transrectal Ultrasound-Guided Prostate Biopsy This is a procedure to take samples of tissue from your prostate. Ultrasound images are used to guide the procedure. It is usually done to check for prostate cancer. What happens before the procedure? Eating and drinking restrictions Follow instructions from your doctor about what you can eat or drink. Medicines  Ask your doctor about changing or stopping: ? Your normal medicines. ? Vitamins, herbs, and supplements. ? Over-the-counter medicines.  Do not take aspirin or ibuprofen unless you are told to. General instructions  Liquid will be used to clear  waste from your butt (enema).  You may have a blood sample taken.  You may have a pee (urine) sample taken.  Plan to have a responsible adult take you home from the hospital or clinic.  If you will be going home right after the procedure, plan to have a responsible adult care for you for the time you are told. This is important.  For your safety, your doctor may: ? Ask you to wash with a soap that kills germs. ? Give you antibiotic medicine. What happens during the procedure?  You will be given one or both of these: ? A medicine to help you relax. ? A medicine to numb the area.  You will be placed on your left side. Your knees may be bent.  A probe with gel on it will be placed in your butt. Pictures will be taken of your prostate and the area around it.  Medicine will be used to numb your prostate.  A needle will be placed in your butt and moved to your prostate.  Prostate tissue will be removed.  The samples will be sent to a lab. The procedure may vary.   What happens after the procedure?  You will be watched until the medicines you were given have worn off.  You may have some pain in your butt. You will be given medicine for it.  Do not drive for 24 hours if you were given a medicine to help you relax. Summary  This procedure is usually  done to check for prostate cancer.  Before the procedure, ask your doctor about changing or stopping your medicines.  You may have some pain in your butt. You will be given medicine for it.  Plan to have a responsible adult take you home from the hospital or clinic. This information is not intended to replace advice given to you by your health care provider. Make sure you discuss any questions you have with your health care provider. Document Revised: 06/17/2020 Document Reviewed: 05/20/2020 Elsevier Patient Education  2021 [place]. Transrectal Ultrasound-Guided Prostate Biopsy, Care After This sheet gives you information  about how to care for yourself after your procedure. Your doctor may also give you more specific instructions. If you have problems or questions, contact your doctor. What can I expect after the procedure? After the procedure, it is common to have:  Pain and discomfort in your butt, especially while sitting.  Pink-colored pee (urine), due to small amounts of blood in the pee.  Burning while peeing (urinating).  Blood in your poop (stool).  Bleeding from your butt.  Blood in your semen. Follow these instructions at home: Medicines  Take over-the-counter and prescription medicines only as told by your doctor.  If you were prescribed antibiotic medicine, take it as told by your doctor. Do not stop taking the antibiotic even if you start to feel better. Activity  Do not drive for 24 hours if you were given a medicine to help you relax (sedative) during your procedure.  Return to your normal activities as told by your doctor. Ask your doctor what activities are safe for you.  Ask your doctor when it is okay for you to have sex.  Do not lift anything that is heavier than 10 lb (4.5 kg), or the limit that you are told, until your doctor says that it is safe.   General instructions  Drink enough water to keep your pee pale yellow.  Watch your pee, poop, and semen for new bleeding or bleeding that gets worse.  Keep all follow-up visits as told by your doctor. This is important.   Contact a doctor if you:  Have blood clots in your pee or poop.  Notice that your pee smells bad or unusual.  Have very bad belly pain.  Have trouble peeing.  Notice that your lower belly feels firm.  Have blood in your pee for more than 2 weeks after the procedure.  Have blood in your semen for more than 2 months after the procedure.  Have problems getting an erection.  Feel sick to your stomach (nauseous) or throw up (vomit).  Have new or worse bleeding in your pee, poop, or semen. Get help  right away if you:  Have a fever or chills.  Have bright red pee.  Have very bad pain that does not get better with medicine.  Cannot pee. Summary  After this procedure, it is common to have pain and discomfort around your butt, especially while sitting.  You may have blood in your pee and poop.  It is common to have blood in your semen for 1-2 months.  If you were prescribed antibiotic medicine, take it as told by your doctor. Do not stop taking the antibiotic even if you start to feel better.  Get help right away if you have a fever or chills. This information is not intended to replace advice given to you by your health care provider. Make sure you discuss any questions you have with your  health care provider. Document Revised: 07/18/2020 Document Reviewed: 05/19/2020 Elsevier Patient Education  2021 [employer].

## 2020-12-01 ENCOUNTER — Encounter (HOSPITAL_COMMUNITY): Payer: Self-pay

## 2020-12-01 ENCOUNTER — Encounter (HOSPITAL_COMMUNITY)
Admission: RE | Admit: 2020-12-01 | Discharge: 2020-12-01 | Disposition: A | Discharge status: Home / Self Care | Payer: Medicare Other | Source: Ambulatory Visit | Attending: Urology | Admitting: Urology

## 2020-12-01 ENCOUNTER — Other Ambulatory Visit: Payer: Self-pay

## 2020-12-01 ENCOUNTER — Other Ambulatory Visit (HOSPITAL_COMMUNITY)
Admission: RE | Admit: 2020-12-01 | Discharge: 2020-12-01 | Disposition: A | Discharge status: Home / Self Care | Payer: Medicare Other | Source: Ambulatory Visit | Attending: Urology | Admitting: Urology

## 2020-12-01 DIAGNOSIS — Z01812 Encounter for preprocedural laboratory examination: Secondary | ICD-10-CM | POA: Diagnosis not present

## 2020-12-01 DIAGNOSIS — Z20822 Contact with and (suspected) exposure to covid-19: Secondary | ICD-10-CM | POA: Diagnosis not present

## 2020-12-01 LAB — SARS CORONAVIRUS 2 (TAT 6-24 HRS): SARS Coronavirus 2: NEGATIVE

## 2020-12-02 ENCOUNTER — Ambulatory Visit: Payer: Medicare Other | Admitting: Urology

## 2020-12-05 ENCOUNTER — Ambulatory Visit (HOSPITAL_COMMUNITY): Payer: Medicare Other

## 2020-12-05 ENCOUNTER — Ambulatory Visit (HOSPITAL_COMMUNITY): Payer: Medicare Other | Admitting: Anesthesiology

## 2020-12-05 ENCOUNTER — Ambulatory Visit (HOSPITAL_COMMUNITY)
Admission: RE | Admit: 2020-12-05 | Discharge: 2020-12-05 | Disposition: A | Discharge status: Home / Self Care | Payer: Medicare Other | Source: Ambulatory Visit | Attending: Urology | Admitting: Urology

## 2020-12-05 ENCOUNTER — Ambulatory Visit (HOSPITAL_COMMUNITY)
Admission: RE | Admit: 2020-12-05 | Discharge: 2020-12-05 | Disposition: A | Discharge status: Home / Self Care | Payer: Medicare Other | Attending: Urology | Admitting: Urology

## 2020-12-05 ENCOUNTER — Other Ambulatory Visit: Payer: Self-pay | Admitting: Urology

## 2020-12-05 ENCOUNTER — Encounter (HOSPITAL_COMMUNITY): Payer: Self-pay | Admitting: Urology

## 2020-12-05 ENCOUNTER — Encounter (HOSPITAL_COMMUNITY)
Admission: RE | Disposition: A | Discharge status: Home / Self Care | Payer: Self-pay | Source: Home / Self Care | Attending: Urology

## 2020-12-05 ENCOUNTER — Other Ambulatory Visit: Payer: Self-pay

## 2020-12-05 DIAGNOSIS — Z841 Family history of disorders of kidney and ureter: Secondary | ICD-10-CM | POA: Diagnosis not present

## 2020-12-05 DIAGNOSIS — Z79899 Other long term (current) drug therapy: Secondary | ICD-10-CM | POA: Diagnosis not present

## 2020-12-05 DIAGNOSIS — C61 Malignant neoplasm of prostate: Secondary | ICD-10-CM | POA: Insufficient documentation

## 2020-12-05 DIAGNOSIS — R972 Elevated prostate specific antigen [PSA]: Secondary | ICD-10-CM

## 2020-12-05 DIAGNOSIS — Z8249 Family history of ischemic heart disease and other diseases of the circulatory system: Secondary | ICD-10-CM | POA: Insufficient documentation

## 2020-12-05 DIAGNOSIS — Z833 Family history of diabetes mellitus: Secondary | ICD-10-CM | POA: Diagnosis not present

## 2020-12-05 DIAGNOSIS — I10 Essential (primary) hypertension: Secondary | ICD-10-CM | POA: Insufficient documentation

## 2020-12-05 HISTORY — PX: PROSTATE BIOPSY: SHX241

## 2020-12-05 SURGERY — BIOPSY, PROSTATE
Anesthesia: General

## 2020-12-05 MED ORDER — FENTANYL CITRATE (PF) 100 MCG/2ML IJ SOLN
INTRAMUSCULAR | Status: AC
  Filled 2020-12-05: qty 2

## 2020-12-05 MED ORDER — GLYCOPYRROLATE 0.2 MG/ML IJ SOLN
INTRAMUSCULAR | Status: DC | PRN
  Administered 2020-12-05: .1 mg via INTRAVENOUS

## 2020-12-05 MED ORDER — LACTATED RINGERS IV SOLN: INTRAVENOUS | Status: DC

## 2020-12-05 MED ORDER — PROPOFOL 500 MG/50ML IV EMUL
INTRAVENOUS | Status: DC | PRN
  Administered 2020-12-05: 100 ug/kg/min via INTRAVENOUS

## 2020-12-05 MED ORDER — PHENYLEPHRINE HCL (PRESSORS) 10 MG/ML IV SOLN
INTRAVENOUS | Status: DC | PRN
  Administered 2020-12-05 (×2): 80 ug via INTRAVENOUS

## 2020-12-05 MED ORDER — ONDANSETRON HCL 4 MG/2ML IJ SOLN
INTRAMUSCULAR | Status: AC
  Filled 2020-12-05: qty 2

## 2020-12-05 MED ORDER — PROPOFOL 10 MG/ML IV BOLUS
INTRAVENOUS | Status: AC
  Filled 2020-12-05: qty 40

## 2020-12-05 MED ORDER — LIDOCAINE HCL (PF) 2 % IJ SOLN
INTRAMUSCULAR | Status: AC
  Filled 2020-12-05: qty 10

## 2020-12-05 MED ORDER — LIDOCAINE HCL (PF) 2 % IJ SOLN
INTRAMUSCULAR | Status: AC
  Filled 2020-12-05: qty 5

## 2020-12-05 MED ORDER — FENTANYL CITRATE (PF) 100 MCG/2ML IJ SOLN
INTRAMUSCULAR | Status: DC | PRN
  Administered 2020-12-05: 50 ug via INTRAVENOUS

## 2020-12-05 MED ORDER — GLYCOPYRROLATE PF 0.2 MG/ML IJ SOSY
PREFILLED_SYRINGE | INTRAMUSCULAR | Status: AC
  Filled 2020-12-05: qty 1

## 2020-12-05 MED ORDER — LACTATED RINGERS IV SOLN: INTRAVENOUS | Status: DC | PRN

## 2020-12-05 MED ORDER — DEXAMETHASONE SODIUM PHOSPHATE 10 MG/ML IJ SOLN
INTRAMUSCULAR | Status: DC | PRN
  Administered 2020-12-05: 10 mg via INTRAVENOUS

## 2020-12-05 MED ORDER — PROPOFOL 10 MG/ML IV BOLUS
INTRAVENOUS | Status: DC | PRN
  Administered 2020-12-05: 70 mg via INTRAVENOUS

## 2020-12-05 MED ORDER — CHLORHEXIDINE GLUCONATE 0.12 % MT SOLN: 15.0000 mL | Freq: Once | OROMUCOSAL | Status: DC

## 2020-12-05 MED ORDER — GENTAMICIN SULFATE 40 MG/ML IJ SOLN
5.0000 mg/kg | INTRAVENOUS | Status: AC
  Administered 2020-12-05: 400 mg via INTRAVENOUS
  Filled 2020-12-05: qty 10

## 2020-12-05 MED ORDER — ONDANSETRON HCL 4 MG/2ML IJ SOLN
INTRAMUSCULAR | Status: DC | PRN
  Administered 2020-12-05: 4 mg via INTRAVENOUS

## 2020-12-05 MED ORDER — ORAL CARE MOUTH RINSE: 15.0000 mL | Freq: Once | OROMUCOSAL | Status: DC

## 2020-12-05 SURGICAL SUPPLY — 7 items
BAG HAMPER (MISCELLANEOUS) ×2 IMPLANT
GLOVE BIO SURGEON STRL SZ8 (GLOVE) IMPLANT
GOWN STRL REUS W/TWL XL LVL3 (GOWN DISPOSABLE) ×2 IMPLANT
INST BIOPSY MAXCORE 18GX25 (NEEDLE) IMPLANT
PAD ARMBOARD 7.5X6 YLW CONV (MISCELLANEOUS) ×2 IMPLANT
SURGILUBE 2OZ TUBE FLIPTOP (MISCELLANEOUS) ×2 IMPLANT
TOWEL OR 17X24 6PK STRL BLUE (TOWEL DISPOSABLE) ×2 IMPLANT

## 2020-12-05 NOTE — Op Note (Signed)
Pre op diagnosis: Elevated PSA  Post op diagnosis: Elevated PSA        Procedure: Transrectal ultrasound of the prostate, Ultrasound Guided Prostate needle biopsy.   Attending: Nicolette Bang  Anesthesia: General  EBL: minimal  Antibiotics: Gentamicin  Drains: none  Findings: 29.4g prostate with prominent right lobe. Prostate measurements: width 4.6cm, height 4.6cm, length 2.9cm no hypoechoic or hyperechoic lesions   Indications: Joseph Hansen is a 65yo male with a history of elevated PSA. After discussing workup he has elected to proceed with prostate biopsy  Procedure in detail: Prior to procedure consent was obtained. The patient was brought to the OR and a brief timeout was done to ensure correct patient, correct procedure, and correct site. General anesthesia was administered and patient was placed in the dorsal lithotomy position. The prostate size was estimated to be 40g by digital rectal exam. The 10 MHz transrectal ultrasound probe was placed into the rectum. Prostate width measured width 4.6cm, height 4.6cm, length 2.9cm . Prostate volume was measured to be 29.4 cc. The biopsy cores were obtained using direct, real-time ultrasound guidance utilizing a standard 12core pattern with one core from the right apex lateral using real time ultrasound guidance to direct the biopsy core being taken from this location, right apex medial using real time ultrasound guidance to direct the biopsy core being taken from this location, left apex lateral using real time ultrasound guidance to direct the biopsy core being taken from this location, left apex medial using real time ultrasound guidance to direct the biopsy core being taken from this location, 2 right mid lateral using real time ultrasound guidance to direct the biopsy core being taken from this location, 2 right mid medial using real time ultrasound guidance to direct the biopsy core being taken from this location, left mid lateral using real  time ultrasound guidance to direct the biopsy core being taken from this location, left mid medial using real time ultrasound guidance to direct the biopsy core being taken from this location, right base lateral using real time ultrasound guidance to direct the biopsy core being taken from this location, right base medial using real time ultrasound guidance to direct the biopsy core being taken from this location, left base lateral using real time ultrasound guidance to direct the biopsy core being taken from this location and left base medial of the prostate using real time ultrasound guidance to direct the biopsy core being taken from this location. The biopsy cores were placed in buffered formalin and sent to pathology. This then concluded the procedure which was well tolerated by the patient.   Complications:. None.   Condition: Stable, extubated, transferred to PACU  Plan: Patient is to be discharged home. They are to followup in 1 week for pathology discussion

## 2020-12-05 NOTE — H&P (Signed)
Urology Admission H&P  Chief Complaint: elevated PSA  History of Present Illness: Joseph Hansen is a 65yo here for prostate biopsy under sedation. His PSA was 55 in 07/2020. No significant LUTS  Past Medical History:  Diagnosis Date  . Deaf    doesn't know sign language  . Hypertension    Past Surgical History:  Procedure Laterality Date  . CHOLECYSTECTOMY      Home Medications:  Current Facility-Administered Medications  Medication Dose Route Frequency Provider Last Rate Last Admin  . gentamicin (GARAMYCIN) 400 mg in dextrose 5 % 100 mL IVPB  5 mg/kg Intravenous 30 min Pre-Op Leota Maka, Candee Furbish, MD       Allergies: No Known Allergies  Family History  Problem Relation Age of Onset  . Chronic Renal Failure Mother   . Atrial fibrillation Mother   . Diabetes Father   . Heart attack Father    Social History:  reports that he has never smoked. He has never used smokeless tobacco. He reports that he does not drink alcohol and does not use drugs.  Review of Systems  All other systems reviewed and are negative.   Physical Exam:  Vital signs in last 24 hours: Temp:  [97.7 F (36.5 C)] 97.7 F (36.5 C) (03/21 0946) Pulse Rate:  [59] 59 (03/21 0946) Resp:  [17] 17 (03/21 0946) BP: (153)/(97) 153/97 (03/21 0946) SpO2:  [99 %] 99 % (03/21 0946) Physical Exam Constitutional:      Appearance: Normal appearance.  HENT:     Head: Normocephalic and atraumatic.     Nose: Nose normal.     Mouth/Throat:     Mouth: Mucous membranes are dry.  Eyes:     Extraocular Movements: Extraocular movements intact.     Conjunctiva/sclera: Conjunctivae normal.     Pupils: Pupils are equal, round, and reactive to light.  Cardiovascular:     Rate and Rhythm: Normal rate and regular rhythm.  Pulmonary:     Effort: Pulmonary effort is normal. No respiratory distress.  Abdominal:     General: Abdomen is flat. There is no distension.  Musculoskeletal:        General: No swelling. Normal range of  motion.  Skin:    General: Skin is warm and dry.  Neurological:     General: No focal deficit present.     Mental Status: He is alert and oriented to person, place, and time.  Psychiatric:        Mood and Affect: Mood normal.        Behavior: Behavior normal.        Thought Content: Thought content normal.        Judgment: Judgment normal.     Laboratory Data:  No results found for this or any previous visit (from the past 24 hour(s)). Recent Results (from the past 240 hour(s))  SARS CORONAVIRUS 2 (TAT 6-24 HRS) Nasopharyngeal Nasopharyngeal Swab     Status: None   Collection Time: 12/01/20 10:00 AM   Specimen: Nasopharyngeal Swab  Result Value Ref Range Status   SARS Coronavirus 2 NEGATIVE NEGATIVE Final    Comment: (NOTE) SARS-CoV-2 target nucleic acids are NOT DETECTED.  The SARS-CoV-2 RNA is generally detectable in upper and lower respiratory specimens during the acute phase of infection. Negative results do not preclude SARS-CoV-2 infection, do not rule out co-infections with other pathogens, and should not be used as the sole basis for treatment or other patient management decisions. Negative results must be combined with clinical  observations, patient history, and epidemiological information. The expected result is Negative.  Fact Sheet for Patients: SugarRoll.be  Fact Sheet for Healthcare Providers: https://www.woods-mathews.com/  This test is not yet approved or cleared by the [place] FDA and  has been authorized for detection and/or diagnosis of SARS-CoV-2 by FDA under an Emergency Use Authorization (EUA). This EUA will remain  in effect (meaning this test can be used) for the duration of the COVID-19 declaration under Se ction 564(b)(1) of the Act, 21 U.S.C. section 360bbb-3(b)(1), unless the authorization is terminated or revoked sooner.  Performed at [facility], [place] [address] [place],  [place] [address]    Creatinine: No results for input(s): CREATININE in the last 168 hours. Baseline Creatinine:   Impression/Assessment:  64yo with elevated PSA  Plan:  The patient and I talked about etiologies of elevated PSA.  We discussed the possible relationship between elevated PSA, prostate cancer, BPH, prostatitis, and UTI.   Conservative treatment of elevated PSA with watchful waiting was discussed with the patient.  All questions were answered.        All of the risks and benefits along with alternatives to prostate biopsy were discussed with the patient.  The patient gave fully informed consent to proceed with a transrectal ultrasound guided biopsy of the prostate for the evaluation of their evated PSA.  Prostate biopsy instructions and antibiotics were given to the patient.    Nicolette Bang 12/05/2020, 11:41 AM

## 2020-12-05 NOTE — Transfer of Care (Signed)
Immediate Anesthesia Transfer of Care Note  Patient: Joseph Hansen  Procedure(s) Performed: PROSTATE BIOPSY (N/A )  Patient Location: PACU  Anesthesia Type:General  Level of Consciousness: awake, alert  and patient cooperative  Airway & Oxygen Therapy: Patient Spontanous Breathing and Patient connected to face mask oxygen  Post-op Assessment: Report given to RN and Post -op Vital signs reviewed and stable  Post vital signs: Reviewed and stable  Last Vitals:  Vitals Value Taken Time  BP 93/70 12/05/20 1220  Temp    Pulse 56 12/05/20 1224  Resp 14 12/05/20 1224  SpO2 100 % 12/05/20 1224  Vitals shown include unvalidated device data.  Last Pain:  Vitals:   12/05/20 0946  TempSrc: Oral  PainSc: 0-No pain      Patients Stated Pain Goal: 2 (27/12/92 9090)  Complications: No complications documented.

## 2020-12-05 NOTE — Anesthesia Preprocedure Evaluation (Signed)
Anesthesia Evaluation  Patient identified by MRN, date of birth, ID band Patient awake    Reviewed: Allergy & Precautions, H&P , NPO status , Patient's Chart, lab work & pertinent test results, reviewed documented beta blocker date and time   Airway Mallampati: II  TM Distance: >3 FB Neck ROM: full    Dental no notable dental hx.    Pulmonary neg pulmonary ROS,    Pulmonary exam normal breath sounds clear to auscultation       Cardiovascular Exercise Tolerance: Good hypertension, negative cardio ROS   Rhythm:regular Rate:Normal     Neuro/Psych negative neurological ROS  negative psych ROS   GI/Hepatic negative GI ROS, Neg liver ROS,   Endo/Other  negative endocrine ROS  Renal/GU negative Renal ROS  negative genitourinary   Musculoskeletal   Abdominal   Peds  Hematology negative hematology ROS (+)   Anesthesia Other Findings Deaf.  Conducted pre-op interview with lip reading and sister present for help.  Patient does not sign.  Reproductive/Obstetrics negative OB ROS                             Anesthesia Physical Anesthesia Plan  ASA: II  Anesthesia Plan: General   Post-op Pain Management:    Induction:   PONV Risk Score and Plan: Propofol infusion  Airway Management Planned:   Additional Equipment:   Intra-op Plan:   Post-operative Plan:   Informed Consent: I have reviewed the patients History and Physical, chart, labs and discussed the procedure including the risks, benefits and alternatives for the proposed anesthesia with the patient or authorized representative who has indicated his/her understanding and acceptance.     Dental Advisory Given  Plan Discussed with: CRNA  Anesthesia Plan Comments:         Anesthesia Quick Evaluation

## 2020-12-05 NOTE — Discharge Instructions (Signed)
General Anesthesia, Adult, Care After This sheet gives you information about how to care for yourself after your procedure. Your health care provider may also give you more specific instructions. If you have problems or questions, contact your health care provider. What can I expect after the procedure? After the procedure, the following side effects are common: Pain or discomfort at the IV site. Nausea. Vomiting. Sore throat. Trouble concentrating. Feeling cold or chills. Feeling weak or tired. Sleepiness and fatigue. Soreness and body aches. These side effects can affect parts of the body that were not involved in surgery. Follow these instructions at home: For the time period you were told by your health care provider: Rest. Do not participate in activities where you could fall or become injured. Do not drive or use machinery. Do not drink alcohol. Do not take sleeping pills or medicines that cause drowsiness. Do not make important decisions or sign legal documents. Do not take care of children on your own.   Eating and drinking Follow any instructions from your health care provider about eating or drinking restrictions. When you feel hungry, start by eating small amounts of foods that are soft and easy to digest (bland), such as toast. Gradually return to your regular diet. Drink enough fluid to keep your urine pale yellow. If you vomit, rehydrate by drinking water, juice, or clear broth. General instructions If you have sleep apnea, surgery and certain medicines can increase your risk for breathing problems. Follow instructions from your health care provider about wearing your sleep device: Anytime you are sleeping, including during daytime naps. While taking prescription pain medicines, sleeping medicines, or medicines that make you drowsy. Have a responsible adult stay with you for the time you are told. It is important to have someone help care for you until you are awake and  alert. Return to your normal activities as told by your health care provider. Ask your health care provider what activities are safe for you. Take over-the-counter and prescription medicines only as told by your health care provider. If you smoke, do not smoke without supervision. Keep all follow-up visits as told by your health care provider. This is important. Contact a health care provider if: You have nausea or vomiting that does not get better with medicine. You cannot eat or drink without vomiting. You have pain that does not get better with medicine. You are unable to pass urine. You develop a skin rash. You have a fever. You have redness around your IV site that gets worse. Get help right away if: You have difficulty breathing. You have chest pain. You have blood in your urine or stool, or you vomit blood. Summary After the procedure, it is common to have a sore throat or nausea. It is also common to feel tired. Have a responsible adult stay with you for the time you are told. It is important to have someone help care for you until you are awake and alert. When you feel hungry, start by eating small amounts of foods that are soft and easy to digest (bland), such as toast. Gradually return to your regular diet. Drink enough fluid to keep your urine pale yellow. Return to your normal activities as told by your health care provider. Ask your health care provider what activities are safe for you. This information is not intended to replace advice given to you by your health care provider. Make sure you discuss any questions you have with your health care provider. Document Revised: 05/19/2020   Document Reviewed: 12/17/2019 Elsevier Patient Education  2021 [place]. Transrectal Ultrasound-Guided Prostate Biopsy, Care After This sheet gives you information about how to care for yourself after your procedure. Your doctor may also give you more specific instructions. If you have  problems or questions, contact your doctor. What can I expect after the procedure? After the procedure, it is common to have:  Pain and discomfort in your butt, especially while sitting.  Pink-colored pee (urine), due to small amounts of blood in the pee.  Burning while peeing (urinating).  Blood in your poop (stool).  Bleeding from your butt.  Blood in your semen. Follow these instructions at home: Medicines  Take over-the-counter and prescription medicines only as told by your doctor.  If you were prescribed antibiotic medicine, take it as told by your doctor. Do not stop taking the antibiotic even if you start to feel better. Activity  Do not drive for 24 hours if you were given a medicine to help you relax (sedative) during your procedure.  Return to your normal activities as told by your doctor. Ask your doctor what activities are safe for you.  Ask your doctor when it is okay for you to have sex.  Do not lift anything that is heavier than 10 lb (4.5 kg), or the limit that you are told, until your doctor says that it is safe.   General instructions  Drink enough water to keep your pee pale yellow.  Watch your pee, poop, and semen for new bleeding or bleeding that gets worse.  Keep all follow-up visits as told by your doctor. This is important.   Contact a doctor if you:  Have blood clots in your pee or poop.  Notice that your pee smells bad or unusual.  Have very bad belly pain.  Have trouble peeing.  Notice that your lower belly feels firm.  Have blood in your pee for more than 2 weeks after the procedure.  Have blood in your semen for more than 2 months after the procedure.  Have problems getting an erection.  Feel sick to your stomach (nauseous) or throw up (vomit).  Have new or worse bleeding in your pee, poop, or semen. Get help right away if you:  Have a fever or chills.  Have bright red pee.  Have very bad pain that does not get better with  medicine.  Cannot pee. Summary  After this procedure, it is common to have pain and discomfort around your butt, especially while sitting.  You may have blood in your pee and poop.  It is common to have blood in your semen for 1-2 months.  If you were prescribed antibiotic medicine, take it as told by your doctor. Do not stop taking the antibiotic even if you start to feel better.  Get help right away if you have a fever or chills. This information is not intended to replace advice given to you by your health care provider. Make sure you discuss any questions you have with your health care provider. Document Revised: 07/18/2020 Document Reviewed: 05/19/2020 Elsevier Patient Education  2021 [employer].

## 2020-12-05 NOTE — OR Nursing (Signed)
12 prostate biopsy specimens obtained per Dr. Alyson Ingles in [place].  R.[employer] [place] tech received and labelled specimens which were placed in containers with formalin boxed and placed in specimen bag for [employer].  Transported to lab per B.[place] OCT. DDallasRN

## 2020-12-05 NOTE — Anesthesia Postprocedure Evaluation (Signed)
Anesthesia Post Note  Patient: Joseph Hansen  Procedure(s) Performed: PROSTATE BIOPSY (N/A )  Patient location during evaluation: PACU Anesthesia Type: General Level of consciousness: awake Pain management: pain level controlled Vital Signs Assessment: post-procedure vital signs reviewed and stable Respiratory status: spontaneous breathing and respiratory function stable Cardiovascular status: blood pressure returned to baseline and stable Postop Assessment: no headache and no apparent nausea or vomiting Anesthetic complications: no   No complications documented.   Last Vitals:  Vitals:   12/05/20 1301 12/05/20 1324  BP: 127/86 (!) 146/83  Pulse: 67 61  Resp: 14 14  Temp:  36.5 C  SpO2: 97% 100%    Last Pain:  Vitals:   12/05/20 1324  TempSrc: Oral  PainSc: 0-No pain                 Louann Sjogren

## 2020-12-06 ENCOUNTER — Encounter (HOSPITAL_COMMUNITY): Payer: Self-pay | Admitting: Urology

## 2020-12-12 ENCOUNTER — Other Ambulatory Visit: Payer: Self-pay

## 2020-12-12 ENCOUNTER — Ambulatory Visit (INDEPENDENT_AMBULATORY_CARE_PROVIDER_SITE_OTHER): Payer: Medicare Other | Admitting: Urology

## 2020-12-12 ENCOUNTER — Encounter: Payer: Self-pay | Admitting: Urology

## 2020-12-12 VITALS — BP 229/121 | HR 64 | Ht 68.0 in | Wt 175.0 lb

## 2020-12-12 DIAGNOSIS — C61 Malignant neoplasm of prostate: Secondary | ICD-10-CM | POA: Diagnosis not present

## 2020-12-12 NOTE — [place] Notes (Signed)
12/12/2020 4:31 PM   [name] Apr 10, 1956 [id number]  Referring provider: Iona Beard, MD [address] [place],  [place] [address]  followup prostate biopsy  HPI: Mr Chrisley is a 69GG here for followup after prostate biopsy. Biopsy revealed High volume Gleason 4+3=7 and Gleason 4+4=8 in 11/12 cores. PSA 75. Moderate LUTS.    PMH: Past Medical History:  Diagnosis Date  . Deaf    doesn't know sign language  . Hypertension     Surgical History: Past Surgical History:  Procedure Laterality Date  . CHOLECYSTECTOMY    . PROSTATE BIOPSY N/A 12/05/2020   Procedure: PROSTATE BIOPSY;  Surgeon: Cleon Gustin, MD;  Location: [facility];  Service: Urology;  Laterality: N/A;    Home Medications:  Allergies as of 12/12/2020   No Known Allergies     Medication List       Accurate as of December 12, 2020  4:31 PM. If you have any questions, ask your nurse or doctor.        atorvastatin 20 MG tablet Commonly known as: LIPITOR Take 20 mg by mouth at bedtime.   labetalol 100 MG tablet Commonly known as: NORMODYNE Take 1 tablet (100 mg total) by mouth 2 (two) times daily.   levofloxacin 750 MG tablet Commonly known as: Levaquin Take 1 tablet (750 mg total) by mouth daily. To be taken the morning of procedure 11/16/2020   losartan 100 MG tablet Commonly known as: COZAAR Take 100 mg by mouth daily.       Allergies: No Known Allergies  Family History: Family History  Problem Relation Age of Onset  . Chronic Renal Failure Mother   . Atrial fibrillation Mother   . Diabetes Father   . Heart attack Father     Social History:  reports that he has never smoked. He has never used smokeless tobacco. He reports that he does not drink alcohol and does not use drugs.  ROS: All other review of systems were reviewed and are negative except what is noted above in HPI  Physical Exam: BP (!) 229/121   Pulse 64   Ht 5\' 8"  (1.727 m)   Wt 175 lb (79.4 kg)   BMI 26.61  kg/m   Constitutional:  Alert and oriented, No acute distress. HEENT: [place] AT, moist mucus membranes.  Trachea midline, no masses. Cardiovascular: No clubbing, cyanosis, or edema. Respiratory: Normal respiratory effort, no increased work of breathing. GI: Abdomen is soft, nontender, nondistended, no abdominal masses GU: No CVA tenderness.  Lymph: No cervical or inguinal lymphadenopathy. Skin: No rashes, bruises or suspicious lesions. Neurologic: Grossly intact, no focal deficits, moving all 4 extremities. Psychiatric: Normal mood and affect.  Laboratory Data: Lab Results  Component Value Date   WBC 9.7 06/02/2019   HGB 13.9 06/02/2019   HCT 44.7 06/02/2019   MCV 87.5 06/02/2019   PLT 292 06/02/2019    Lab Results  Component Value Date   CREATININE 1.27 (H) 06/02/2019    No results found for: PSA  No results found for: TESTOSTERONE  No results found for: HGBA1C  Urinalysis    Component Value Date/Time   COLORURINE YELLOW 05/08/2015 1730   APPEARANCEUR Clear 10/19/2020 0928   LABSPEC 1.020 05/08/2015 1730   PHURINE 5.5 05/08/2015 1730   GLUCOSEU Negative 10/19/2020 0928   HGBUR NEGATIVE 05/08/2015 1730   BILIRUBINUR Negative 10/19/2020 0928   KETONESUR NEGATIVE 05/08/2015 1730   PROTEINUR Negative 10/19/2020 0928   PROTEINUR NEGATIVE 05/08/2015  1730   UROBILINOGEN 0.2 05/08/2015 1730   NITRITE Negative 10/19/2020 0928   NITRITE NEGATIVE 05/08/2015 1730   LEUKOCYTESUR Negative 10/19/2020 0928    Lab Results  Component Value Date   LABMICR Comment 10/19/2020   BACTERIA MANY (A) 03/01/2007    Pertinent Imaging:  No results found for this or any previous visit.  No results found for this or any previous visit.  No results found for this or any previous visit.  No results found for this or any previous visit.  No results found for this or any previous visit.  No results found for this or any previous visit.  No results found for this or any previous  visit.  No results found for this or any previous visit.   Assessment & Plan:    1. Prostate cancer ([place]) -I discussed the natural history of high risk prostate cancer with the patient and the various treatment options including active surveillance, RALP, IMRT, brachytherapy, cryotherapy, HIFU and ADT. After discussing the options the patient elects for IMRt and ADT. I will have him see Dr. Tammi Klippel for consideration of IMRT. CT zabd/pelvis and bone scan ordered. - Basic metabolic panel - NM Bone Scan Whole Body; Future - CT Abdomen Pelvis W Wo Contrast; Future - Ambulatory referral to Radiation Oncology   Return in about 4 weeks (around 01/09/2021) for firmagon.  Nicolette Bang, MD  [facility] [place]

## 2020-12-12 NOTE — Patient Instructions (Signed)
Prostate Cancer  The prostate is a male gland that helps make semen. It is located below a man's bladder, in front of the rectum. Prostate cancer is when abnormal cells grow in this gland. What are the causes? The cause of this condition is not known. What increases the risk? You are more likely to develop this condition if:  You are 67 years of age or older.  You are African American.  You have a family history of prostate cancer.  You have a family history of breast cancer. What are the signs or symptoms? Symptoms of this condition include:  A need to pee often.  Peeing that is weak, or pee that stops and starts.  Trouble starting or stopping your pee.  Inability to pee.  Blood in your pee or semen.  Pain in the lower back, lower belly (abdomen), hips, or upper thighs.  Trouble getting an erection.  Trouble emptying all of your pee. How is this treated? Treatment for this condition depends on your age, your health, the kind of treatment you like, and how far the cancer has spread. Treatments include:  Being watched. This is called observation. You will be tested from time to time, but you will not get treated. Tests are to make sure that the cancer is not growing.  Surgery. This may be done to remove the prostate, to remove the testicles, or to freeze or kill cancer cells.  Radiation. This uses a strong beam to kill cancer cells.  Ultrasound energy. This uses strong sound waves to kill cancer cells.  Chemotherapy. This uses medicines that stop cancer cells from increasing. This kills cancer cells and healthy cells.  Targeted therapy. This kills cancer cells only. Healthy cells are not affected.  Hormone treatment. This stops the body from making hormones that help the cancer cells to grow. Follow these instructions at home:  Take over-the-counter and prescription medicines only as told by your doctor.  Eat a healthy diet.  Get plenty of sleep.  Ask your  doctor for help to find a support group for men with prostate cancer.  If you have to go to the hospital, let your cancer doctor (oncologist) know.  Treatment may affect your ability to have sex. Touch, hold, hug, and caress your partner to have intimate moments.  Keep all follow-up visits as told by your doctor. This is important. Contact a doctor if:  You have new or more trouble peeing.  You have new or more blood in your pee.  You have new or more pain in your hips, back, or chest. Get help right away if:  You have weakness in your legs.  You lose feeling in your legs.  You cannot control your pee or your poop (stool).  You have chills or a fever. Summary  The prostate is a male gland that helps make semen.  Prostate cancer is when abnormal cells grow in this gland.  Treatment includes doing surgery, using medicines, using very strong beams, or watching without treatment.  Ask your doctor for help to find a support group for men with prostate cancer.  Contact a doctor if you have problems peeing or have any new pain that you did not have before. This information is not intended to replace advice given to you by your health care provider. Make sure you discuss any questions you have with your health care provider. Document Revised: 08/18/2019 Document Reviewed: 08/18/2019 Elsevier Patient Education  2021 Elsevier Inc.  

## 2020-12-13 LAB — BASIC METABOLIC PANEL
BUN/Creatinine Ratio: 10 (ref 10–24)
BUN: 13 mg/dL (ref 8–27)
CO2: 22 mmol/L (ref 20–29)
Calcium: 9.5 mg/dL (ref 8.6–10.2)
Chloride: 105 mmol/L (ref 96–106)
Creatinine, Ser: 1.29 mg/dL — ABNORMAL HIGH (ref 0.76–1.27)
Glucose: 92 mg/dL (ref 65–99)
Potassium: 4.3 mmol/L (ref 3.5–5.2)
Sodium: 141 mmol/L (ref 134–144)
eGFR: 61 mL/min/{1.73_m2} (ref >59)

## 2020-12-13 NOTE — Addendum Note (Signed)
Addended by: Gibson Ramp K on: 12/13/2020 11:10 AM   Modules accepted: Orders

## 2021-01-20 ENCOUNTER — Encounter (HOSPITAL_COMMUNITY): Payer: Self-pay

## 2021-01-20 ENCOUNTER — Encounter (HOSPITAL_COMMUNITY)
Admission: RE | Admit: 2021-01-20 | Discharge: 2021-01-20 | Disposition: A | Discharge status: Home / Self Care | Payer: Medicare Other | Source: Ambulatory Visit | Attending: Urology | Admitting: Urology

## 2021-01-20 ENCOUNTER — Ambulatory Visit (HOSPITAL_COMMUNITY): Payer: Medicare Other

## 2021-01-20 ENCOUNTER — Ambulatory Visit (HOSPITAL_COMMUNITY)
Admission: RE | Admit: 2021-01-20 | Discharge: 2021-01-20 | Disposition: A | Discharge status: Home / Self Care | Payer: Medicare Other | Source: Ambulatory Visit | Attending: Urology | Admitting: Urology

## 2021-01-20 DIAGNOSIS — C61 Malignant neoplasm of prostate: Secondary | ICD-10-CM

## 2021-01-20 LAB — POCT I-STAT CREATININE: Creatinine, Ser: 1.4 mg/dL — ABNORMAL HIGH (ref 0.61–1.24)

## 2021-01-20 MED ORDER — TECHNETIUM TC 99M MEDRONATE IV KIT
20.0000 | PACK | Freq: Once | INTRAVENOUS | Status: AC | PRN
  Administered 2021-01-20: 21 via INTRAVENOUS

## 2021-01-20 MED ORDER — IOHEXOL 300 MG/ML  SOLN
100.0000 mL | Freq: Once | INTRAMUSCULAR | Status: AC | PRN
  Administered 2021-01-20: 100 mL via INTRAVENOUS

## 2021-01-26 NOTE — [place] Notes (Signed)
Sent via mychart and mail. 

## 2021-01-27 ENCOUNTER — Other Ambulatory Visit: Payer: Self-pay

## 2021-01-27 ENCOUNTER — Telehealth: Payer: Self-pay

## 2021-01-27 ENCOUNTER — Ambulatory Visit (INDEPENDENT_AMBULATORY_CARE_PROVIDER_SITE_OTHER): Payer: Medicare Other | Admitting: Urology

## 2021-01-27 VITALS — BP 178/106 | HR 71 | Temp 98.7°F | Ht 68.0 in | Wt 175.0 lb

## 2021-01-27 DIAGNOSIS — C61 Malignant neoplasm of prostate: Secondary | ICD-10-CM

## 2021-01-27 MED ORDER — ELIGARD 45 MG ~~LOC~~ KIT: 45.0000 mg | PACK | SUBCUTANEOUS | 1 refills | Status: DC

## 2021-01-27 MED ORDER — LEUPROLIDE ACETATE (6 MONTH) 45 MG ~~LOC~~ KIT
45.0000 mg | PACK | Freq: Once | SUBCUTANEOUS | Status: AC
  Administered 2021-01-27: 45 mg via SUBCUTANEOUS

## 2021-01-27 MED ORDER — DEGARELIX ACETATE(240 MG DOSE) 120 MG/VIAL ~~LOC~~ SOLR: 240.0000 mg | Freq: Once | SUBCUTANEOUS | Status: AC

## 2021-01-27 NOTE — Telephone Encounter (Signed)
-----   Message from Cleon Gustin, MD sent at 01/26/2021  8:06 AM EDT ----- normal ----- Message ----- From: Dorisann Frames, RN Sent: 01/23/2021  11:16 AM EDT To: Cleon Gustin, MD  Please review

## 2021-01-27 NOTE — Patient Instructions (Signed)
Hormone Suppression Therapy for Prostate Cancer  Hormone suppression therapy is a treatment that can help to slow the growth of cancer cells in the prostate gland. It is also called androgen deprivation therapy (ADT) or androgen suppression therapy. Hormone suppression therapy targets hormones in the body that help cancer cells grow-these hormones are called androgens. Hormone suppression therapy alone will not cure prostate cancer, but it can slow the growth of cancer cells and may shrink tumors over time. Your health care provider can help you find the best treatment that fits your lifestyle. Hormone suppression therapy may be used in the following cases:  When prostate cancer has spread too far to other places in the body and cannot be cured by surgery or radiation.  When a person has health problems that prevent them from having surgery or radiation.  Before radiation to help shrink the size of the cancer and make the radiation treatment more effective.  If the prostate cancer remains or comes back following treatment with surgery or radiation. What are the types of hormone suppression therapy? Orchiectomy Orchiectomy, also called surgical castration, is a surgery to remove one or both testicles. The testicles make the two main androgens-testosterone and dihydrotestosterone. This surgery reduces the levels of testosterone in the blood, leading to decreased androgen production. Medicine therapy Medicine therapy, also called medical or chemical castration, involves taking medicines to keep your body from making or using androgens. Medicines can do this in one of three ways: 1. Reducing androgen production by the testicles: ? Luteinizing hormone-releasing hormone (LHRH) agonist medicines. These medicines are injected or implanted under your skin to lower the amount of androgens that your testicles make. If you take these medicines, you may also be prescribed other medicines to help with side  effects. ? LHRH antagonist medicines. These medicines also work to lower the amount of androgens made in the testicles but they work faster than LHRH agonist medicines and have less severe side effects. They are given as a monthly injection under the skin, and they are used when prostate cancer is in an advanced stage. ? Estrogens (male hormones). These medicines help reduce androgen production by the testicles. Usually, other types of hormone suppression therapy are used first based. If they do not work well or if they stop working, then estrogen may be given. 2. Blocking androgen attachment throughout the body. ? Anti-androgen medicines, also called androgen receptor antagonists, block areas on the body where androgens attach. These are pills that are usually used in combination with other types of hormone suppression therapy, like orchiectomy and other medicines. 3. Blocking androgen production throughout the body. ? Androgen synthesis inhibitor medicines. These medicines help to stop other areas of the body from making androgens. They are taken as pills. They may be used if the prostate cancer is advanced and has not gotten better with surgery or other medicines. A steroid medicine may be given with this type of medicine to help with side effects. What are the risks? Hormone suppression therapy may cause side effects, including:  Hot flashes.  Sexual side effects: ? Decrease or lack of sexual desire. ? Decrease in size of penis or testicles. ? Inability to get an erection (erectile dysfunction, or impotence). ? Breast tenderness or increase in breast size.  Fatigue.  Weight gain.  Thinning of the bones (osteoporosis) or loss of muscle mass.  Depression.  Increased cholesterol levels.  Trouble with thinking or focusing. Hormone suppression therapy may also increase your risk of high blood pressure (  hypertension), stroke, heart attack, or diabetes. What are the benefits? One of the  main benefits of hormone suppression therapy is having additional treatment options. You may have only one type of treatment or two or more types at the same time. Treatments may be combined to:  Help with side effects.  Treat advanced cancer. Contact a health care provider if:  You have pain or side effects that do not get better with treatment.  You have trouble urinating.  You have new side effects that do not go away. Get help right away if:  You have severe chest pain.  You have trouble breathing.  You have an irregular heartbeat.  You have numbness or paralysis in the lower half of your body.  You are confused.  You have trouble talking or understanding speech. These symptoms may be an emergency. Do not wait to see if the symptoms will go away. Get medical help right away. Call your local emergency services (911 in the [place] Do not drive yourself to the hospital. Summary  Hormone suppression therapy is a treatment that can help to slow the growth of cancer cells in the prostate (prostate gland).  Hormone suppression therapy alone will not cure prostate cancer, but it can slow the growth of prostate cancer and may shrink tumors over time.  Treatment to suppress hormones may include surgery or medicines. This information is not intended to replace advice given to you by your health care provider. Make sure you discuss any questions you have with your health care provider. Document Revised: 07/30/2019 Document Reviewed: 07/30/2019 Elsevier Patient Education  2021 Elsevier Inc.  

## 2021-01-27 NOTE — Telephone Encounter (Signed)
Patient family here today with patient for appointment. Notified of results.

## 2021-01-27 NOTE — [place] Notes (Signed)
post void residual= 33  Urological Symptom Review  Patient is experiencing the following symptoms: none   Review of Systems  Gastrointestinal (upper)  : Negative for upper GI symptoms  Gastrointestinal (lower) : Negative for lower GI symptoms  Constitutional : Negative for symptoms  Skin: Negative for skin symptoms  Eyes: Negative for eye symptoms  Ear/Nose/Throat : Negative for Ear/Nose/Throat symptoms  Hematologic/Lymphatic: Negative for Hematologic/Lymphatic symptoms  Cardiovascular : Negative for cardiovascular symptoms  Respiratory : Negative for respiratory symptoms  Endocrine: Negative for endocrine symptoms  Musculoskeletal: Negative for musculoskeletal symptoms  Neurological: Negative for neurological symptoms  Psychologic: Negative for psychiatric symptoms

## 2021-01-27 NOTE — [place] Notes (Signed)
01/27/2021 10:09 AM   Joseph Hansen 11-18-1955 [id number]  Referring provider: Iona Beard, MD [address] [place],  [place] [address]  followup prostate cancer  HPI: [name] is a 65yo here for followup for prostate cancer. He has elected to proceed with IMRT. CT and bone scan revealed no evidence of metastatic disease. No complaints today   PMH: Past Medical History:  Diagnosis Date  . Deaf    doesn't know sign language  . Hypertension     Surgical History: Past Surgical History:  Procedure Laterality Date  . CHOLECYSTECTOMY    . PROSTATE BIOPSY N/A 12/05/2020   Procedure: PROSTATE BIOPSY;  Surgeon: Cleon Gustin, MD;  Location: [facility];  Service: Urology;  Laterality: N/A;    Home Medications:  Allergies as of 01/27/2021   No Known Allergies     Medication List       Accurate as of Jan 27, 2021 10:09 AM. If you have any questions, ask your nurse or doctor.        atorvastatin 20 MG tablet Commonly known as: LIPITOR Take 20 mg by mouth at bedtime.   labetalol 100 MG tablet Commonly known as: NORMODYNE Take 1 tablet (100 mg total) by mouth 2 (two) times daily.   levofloxacin 750 MG tablet Commonly known as: Levaquin Take 1 tablet (750 mg total) by mouth daily. To be taken the morning of procedure 11/16/2020   losartan 100 MG tablet Commonly known as: COZAAR Take 100 mg by mouth daily.       Allergies: No Known Allergies  Family History: Family History  Problem Relation Age of Onset  . Chronic Renal Failure Mother   . Atrial fibrillation Mother   . Diabetes Father   . Heart attack Father     Social History:  reports that he has never smoked. He has never used smokeless tobacco. He reports that he does not drink alcohol and does not use drugs.  ROS: All other review of systems were reviewed and are negative except what is noted above in HPI  Physical Exam: BP (!) 178/106   Pulse 71   Temp 98.7 F (37.1 C)   Ht 5\' 8"   (1.727 m)   Wt 175 lb (79.4 kg)   BMI 26.61 kg/m   Constitutional:  Alert and oriented, No acute distress. HEENT: [place] AT, moist mucus membranes.  Trachea midline, no masses. Cardiovascular: No clubbing, cyanosis, or edema. Respiratory: Normal respiratory effort, no increased work of breathing. GI: Abdomen is soft, nontender, nondistended, no abdominal masses GU: No CVA tenderness.  Lymph: No cervical or inguinal lymphadenopathy. Skin: No rashes, bruises or suspicious lesions. Neurologic: Grossly intact, no focal deficits, moving all 4 extremities. Psychiatric: Normal mood and affect.  Laboratory Data: Lab Results  Component Value Date   WBC 9.7 06/02/2019   HGB 13.9 06/02/2019   HCT 44.7 06/02/2019   MCV 87.5 06/02/2019   PLT 292 06/02/2019    Lab Results  Component Value Date   CREATININE 1.40 (H) 01/20/2021    No results found for: PSA  No results found for: TESTOSTERONE  No results found for: HGBA1C  Urinalysis    Component Value Date/Time   COLORURINE YELLOW 05/08/2015 1730   APPEARANCEUR Clear 10/19/2020 0928   LABSPEC 1.020 05/08/2015 1730   PHURINE 5.5 05/08/2015 1730   GLUCOSEU Negative 10/19/2020 0928   HGBUR NEGATIVE 05/08/2015 1730   BILIRUBINUR Negative 10/19/2020 0928   [place] 05/08/2015 1730  PROTEINUR Negative 10/19/2020 0928   PROTEINUR NEGATIVE 05/08/2015 1730   UROBILINOGEN 0.2 05/08/2015 1730   NITRITE Negative 10/19/2020 0928   NITRITE NEGATIVE 05/08/2015 1730   LEUKOCYTESUR Negative 10/19/2020 0928    Lab Results  Component Value Date   LABMICR Comment 10/19/2020   BACTERIA MANY (A) 03/01/2007    Pertinent Imaging: Ct and bone scan: Images reviewed and discussed with the patient and the family No results found for this or any previous visit.  No results found for this or any previous visit.  No results found for this or any previous visit.  No results found for this or any previous visit.  No results found for  this or any previous visit.  No results found for this or any previous visit.  No results found for this or any previous visit.  No results found for this or any previous visit.   Assessment & Plan:    1. Prostate cancer ([place]) -we will schedule with fiducial markers and [employer] in 6 weeks. Risks/benefits/alternatives -eligard 45mg  today - degarelix (FIRMAGON) injection 240 mg - BLADDER SCAN AMB NON-IMAGING   No follow-ups on file.  Nicolette Bang, MD  [facility] [place]

## 2021-02-14 ENCOUNTER — Encounter: Payer: Self-pay | Admitting: Urology

## 2021-02-28 NOTE — Patient Instructions (Signed)
Joseph Hansen  02/28/2021     @PREFPERIOPPHARMACY @   Your procedure is scheduled on  03/06/2021.   Report to [facility] at   1145   A.M.   Call this number if you have problems the morning of surgery:  [phone]   Remember:  Do not eat or drink after midnight.      Take these medicines the morning of surgery with A SIP OF WATER    Labetolol.    Please brush your teeth.  Do not wear jewelry, make-up or nail polish.  Do not wear lotions, powders, or perfumes, or deodorant.  Do not shave 48 hours prior to surgery.  Men may shave face and neck.  Do not bring valuables to the hospital.  [facility] is not responsible for any belongings or valuables.  Contacts, dentures or bridgework may not be worn into surgery.  Leave your suitcase in the car.  After surgery it may be brought to your room.  For patients admitted to the hospital, discharge time will be determined by your treatment team.  Patients discharged the day of surgery will not be allowed to drive home and must have someone with them for 24 hours.    Special instructions:    DO NOT smoke tobacco or vape fore 24 hours before your procedure.  Please read over the following fact sheets that you were given. Coughing and Deep Breathing, Surgical Site Infection Prevention, Anesthesia Post-op Instructions, and Care and Recovery After Surgery      [email or web address]  Transrectal Ultrasound-Guided Prostate Gold Seed Placement, Care After This sheet gives you information about how to care for yourself after your procedure. Your health care provider may also give you more specific instructions. If you have problems or questions, contact your health careprovider. What can I expect after the procedure? After the procedure, it is common to have: Light bleeding from the rectum. Bruising or tenderness in the area behind the scrotum (perineum). Small  amounts of blood in your urine. This should only last for a couple of days. Light brown or red semen. This may last for a couple of weeks. Follow these instructions at home: Medicines  Take over-the-counter and prescription medicines only as told by your health care provider. If you were prescribed an antibiotic medicine, take it as told by your health care provider. Do not stop taking the antibiotic even if you start to feel better.  Activity Do not drive for 24 hours if you were given a medicine to help you relax (sedative) during your procedure. Do not drive or use heavy machinery while taking prescription pain medicine. Return to your normal activities as told by your health care provider. Ask your health care provider what activities are safe for you. Ask your health care provider when it is safe for you to resume sexual activity. General instructions Do not take baths, swim, or use a hot tub until your health care provider approves. Drink enough water to keep your urine pale yellow. Plan to have a responsible adult care for you for at least 24 hours after you leave the hospital or clinic. This is important. Keep all follow-up visits as told by your health care provider. This is important. Managing pain, stiffness, and swelling If directed, put ice on the affected area: Put ice in a plastic bag. Place a towel between your skin and the bag. Leave the ice on for 20 minutes, 2-3 times a day. Try  not to sit directly on the area behind the scrotum. A soft cushion can help with discomfort. Contact a health care provider if: You have a fever or chills. You have more blood in your urine instead of less. You have blood in your urine for more than 2-3 days after the procedure. You have trouble urinating or having a bowel movement. You have pain or burning when urinating. You have nausea or you vomit. Get help right away if: You have severe pain that does not get better with medicine. Your  urine is bright red. You cannot urinate. You have rectal bleeding that gets worse. You have shortness of breath. Summary After the procedure, you may have blood in your urine and light bleeding from the rectum. Return to your normal activities as told by your health care provider. Ask your health care provider what activities are safe for you. Take over-the-counter and prescription medicines only as told by your health care provider. Contact your health care provider right away if your urine is bright red or you cannot urinate. This information is not intended to replace advice given to you by your health care provider. Make sure you discuss any questions you have with your healthcare provider. Document Revised: 08/26/2020 Document Reviewed: 05/20/2020 Elsevier Patient Education  [place] Anesthesia, Adult, Care After This sheet gives you information about how to care for yourself after your procedure. Your health care provider may also give you more specific instructions. If you have problems or questions, contact your health careprovider. What can I expect after the procedure? After the procedure, the following side effects are common: Pain or discomfort at the IV site. Nausea. Vomiting. Sore throat. Trouble concentrating. Feeling cold or chills. Feeling weak or tired. Sleepiness and fatigue. Soreness and body aches. These side effects can affect parts of the body that were not involved in surgery. Follow these instructions at home: For the time period you were told by your health care provider:  Rest. Do not participate in activities where you could fall or become injured. Do not drive or use machinery. Do not drink alcohol. Do not take sleeping pills or medicines that cause drowsiness. Do not make important decisions or sign legal documents. Do not take care of children on your own.  Eating and drinking Follow any instructions from your health care provider  about eating or drinking restrictions. When you feel hungry, start by eating small amounts of foods that are soft and easy to digest (bland), such as toast. Gradually return to your regular diet. Drink enough fluid to keep your urine pale yellow. If you vomit, rehydrate by drinking water, juice, or clear broth. General instructions If you have sleep apnea, surgery and certain medicines can increase your risk for breathing problems. Follow instructions from your health care provider about wearing your sleep device: Anytime you are sleeping, including during daytime naps. While taking prescription pain medicines, sleeping medicines, or medicines that make you drowsy. Have a responsible adult stay with you for the time you are told. It is important to have someone help care for you until you are awake and alert. Return to your normal activities as told by your health care provider. Ask your health care provider what activities are safe for you. Take over-the-counter and prescription medicines only as told by your health care provider. If you smoke, do not smoke without supervision. Keep all follow-up visits as told by your health care provider. This is important. Contact a health care provider  if: You have nausea or vomiting that does not get better with medicine. You cannot eat or drink without vomiting. You have pain that does not get better with medicine. You are unable to pass urine. You develop a skin rash. You have a fever. You have redness around your IV site that gets worse. Get help right away if: You have difficulty breathing. You have chest pain. You have blood in your urine or stool, or you vomit blood. Summary After the procedure, it is common to have a sore throat or nausea. It is also common to feel tired. Have a responsible adult stay with you for the time you are told. It is important to have someone help care for you until you are awake and alert. When you feel hungry,  start by eating small amounts of foods that are soft and easy to digest (bland), such as toast. Gradually return to your regular diet. Drink enough fluid to keep your urine pale yellow. Return to your normal activities as told by your health care provider. Ask your health care provider what activities are safe for you. This information is not intended to replace advice given to you by your health care provider. Make sure you discuss any questions you have with your healthcare provider. Document Revised: 05/19/2020 Document Reviewed: 12/17/2019 Elsevier Patient Education  2022 [place]. How to Use Chlorhexidine for Bathing Chlorhexidine gluconate (CHG) is a germ-killing (antiseptic) solution that is used to clean the skin. It can get rid of the bacteria that normally live on the skin and can keep them away for about 24 hours. To clean your skin with CHG, you may be given: A CHG solution to use in the shower or as part of a sponge bath. A prepackaged cloth that contains CHG. Cleaning your skin with CHG may help lower the risk for infection: While you are staying in the intensive care unit of the hospital. If you have a vascular access, such as a central line, to provide short-term or long-term access to your veins. If you have a catheter to drain urine from your bladder. If you are on a ventilator. A ventilator is a machine that helps you breathe by moving air in and out of your lungs. After surgery. What are the risks? Risks of using CHG include: A skin reaction. Hearing loss, if CHG gets in your ears. Eye injury, if CHG gets in your eyes and is not rinsed out. The CHG product catching fire. Make sure that you avoid smoking and flames after applying CHG to your skin. Do not use CHG: If you have a chlorhexidine allergy or have previously reacted to chlorhexidine. On babies younger than 61 months of age. How to use CHG solution Use CHG only as told by your health care provider, and follow  the instructions on the label. Use the full amount of CHG as directed. Usually, this is one bottle. During a shower Follow these steps when using CHG solution during a shower (unless your health care provider gives you different instructions): Start the shower. Use your normal soap and shampoo to wash your face and hair. Turn off the shower or move out of the shower stream. Pour the CHG onto a clean washcloth. Do not use any type of brush or rough-edged sponge. Starting at your neck, lather your body down to your toes. Make sure you follow these instructions: If you will be having surgery, pay special attention to the part of your body where you will be  having surgery. Scrub this area for at least 1 minute. Do not use CHG on your head or face. If the solution gets into your ears or eyes, rinse them well with water. Avoid your genital area. Avoid any areas of skin that have broken skin, cuts, or scrapes. Scrub your back and under your arms. Make sure to wash skin folds. Let the lather sit on your skin for 1-2 minutes or as long as told by your health care provider. Thoroughly rinse your entire body in the shower. Make sure that all body creases and crevices are rinsed well. Dry off with a clean towel. Do not put any substances on your body afterward--such as powder, lotion, or perfume--unless you are told to do so by your health care provider. Only use lotions that are recommended by the manufacturer. Put on clean clothes or pajamas. If it is the night before your surgery, sleep in clean sheets.  During a sponge bath Follow these steps when using CHG solution during a sponge bath (unless your health care provider gives you different instructions): Use your normal soap and shampoo to wash your face and hair. Pour the CHG onto a clean washcloth. Starting at your neck, lather your body down to your toes. Make sure you follow these instructions: If you will be having surgery, pay special  attention to the part of your body where you will be having surgery. Scrub this area for at least 1 minute. Do not use CHG on your head or face. If the solution gets into your ears or eyes, rinse them well with water. Avoid your genital area. Avoid any areas of skin that have broken skin, cuts, or scrapes. Scrub your back and under your arms. Make sure to wash skin folds. Let the lather sit on your skin for 1-2 minutes or as long as told by your health care provider. Using a different clean, wet washcloth, thoroughly rinse your entire body. Make sure that all body creases and crevices are rinsed well. Dry off with a clean towel. Do not put any substances on your body afterward--such as powder, lotion, or perfume--unless you are told to do so by your health care provider. Only use lotions that are recommended by the manufacturer. Put on clean clothes or pajamas. If it is the night before your surgery, sleep in clean sheets. How to use CHG prepackaged cloths Only use CHG cloths as told by your health care provider, and follow the instructions on the label. Use the CHG cloth on clean, dry skin. Do not use the CHG cloth on your head or face unless your health care provider tells you to. When washing with the CHG cloth: Avoid your genital area. Avoid any areas of skin that have broken skin, cuts, or scrapes. Before surgery Follow these steps when using a CHG cloth to clean before surgery (unless your health care provider gives you different instructions): Using the CHG cloth, vigorously scrub the part of your body where you will be having surgery. Scrub using a back-and-forth motion for 3 minutes. The area on your body should be completely wet with CHG when you are done scrubbing. Do not rinse. Discard the cloth and let the area air-dry. Do not put any substances on the area afterward, such as powder, lotion, or perfume. Put on clean clothes or pajamas. If it is the night before your surgery, sleep  in clean sheets.  For general bathing Follow these steps when using CHG cloths for general bathing (unless your  health care provider gives you different instructions). Use a separate CHG cloth for each area of your body. Make sure you wash between any folds of skin and between your fingers and toes. Wash your body in the following order, switching to a new cloth after each step: The front of your neck, shoulders, and chest. Both of your arms, under your arms, and your hands. Your stomach and groin area, avoiding the genitals. Your right leg and foot. Your left leg and foot. The back of your neck, your back, and your buttocks. Do not rinse. Discard the cloth and let the area air-dry. Do not put any substances on your body afterward--such as powder, lotion, or perfume--unless you are told to do so by your health care provider. Only use lotions that are recommended by the manufacturer. Put on clean clothes or pajamas. Contact a health care provider if: Your skin gets irritated after scrubbing. You have questions about using your solution or cloth. Get help right away if: Your eyes become very red or swollen. Your eyes itch badly. Your skin itches badly and is red or swollen. Your hearing changes. You have trouble seeing. You have swelling or tingling in your mouth or throat. You have trouble breathing. You swallow any chlorhexidine. Summary Chlorhexidine gluconate (CHG) is a germ-killing (antiseptic) solution that is used to clean the skin. Cleaning your skin with CHG may help to lower your risk for infection. You may be given CHG to use for bathing. It may be in a bottle or in a prepackaged cloth to use on your skin. Carefully follow your health care provider's instructions and the instructions on the product label. Do not use CHG if you have a chlorhexidine allergy. Contact your health care provider if your skin gets irritated after scrubbing. This information is not intended to replace  advice given to you by your health care provider. Make sure you discuss any questions you have with your healthcare provider. Document Revised: 01/15/2020 Document Reviewed: 02/19/2020 Elsevier Patient Education  [place].

## 2021-03-02 ENCOUNTER — Encounter (HOSPITAL_COMMUNITY)
Admission: RE | Admit: 2021-03-02 | Discharge: 2021-03-02 | Disposition: A | Discharge status: Home / Self Care | Payer: Medicare Other | Source: Ambulatory Visit | Attending: Urology | Admitting: Urology

## 2021-03-02 ENCOUNTER — Other Ambulatory Visit: Payer: Self-pay

## 2021-03-02 DIAGNOSIS — Z01810 Encounter for preprocedural cardiovascular examination: Secondary | ICD-10-CM | POA: Insufficient documentation

## 2021-03-06 ENCOUNTER — Ambulatory Visit (HOSPITAL_COMMUNITY): Payer: Medicare Other

## 2021-03-06 ENCOUNTER — Ambulatory Visit (HOSPITAL_COMMUNITY): Payer: Medicare Other | Admitting: Anesthesiology

## 2021-03-06 ENCOUNTER — Ambulatory Visit (HOSPITAL_COMMUNITY)
Admission: RE | Admit: 2021-03-06 | Discharge: 2021-03-06 | Disposition: A | Discharge status: Home / Self Care | Payer: Medicare Other | Attending: Urology | Admitting: Urology

## 2021-03-06 ENCOUNTER — Encounter (HOSPITAL_COMMUNITY): Payer: Self-pay | Admitting: Urology

## 2021-03-06 ENCOUNTER — Encounter (HOSPITAL_COMMUNITY)
Admission: RE | Disposition: A | Discharge status: Home / Self Care | Payer: Self-pay | Source: Home / Self Care | Attending: Urology

## 2021-03-06 DIAGNOSIS — Z79899 Other long term (current) drug therapy: Secondary | ICD-10-CM | POA: Insufficient documentation

## 2021-03-06 DIAGNOSIS — C61 Malignant neoplasm of prostate: Secondary | ICD-10-CM | POA: Diagnosis present

## 2021-03-06 HISTORY — PX: SPACE OAR INSTILLATION: SHX6769

## 2021-03-06 SURGERY — INJECTION, HYDROGEL SPACER
Anesthesia: General | Site: Prostate

## 2021-03-06 MED ORDER — EPHEDRINE SULFATE 50 MG/ML IJ SOLN
INTRAMUSCULAR | Status: DC | PRN
  Administered 2021-03-06: 15 mg via INTRAVENOUS
  Administered 2021-03-06 (×2): 10 mg via INTRAVENOUS

## 2021-03-06 MED ORDER — ONDANSETRON HCL 4 MG/2ML IJ SOLN
INTRAMUSCULAR | Status: DC | PRN
  Administered 2021-03-06: 4 mg via INTRAVENOUS

## 2021-03-06 MED ORDER — 0.9 % SODIUM CHLORIDE (POUR BTL) OPTIME
TOPICAL | Status: DC | PRN
  Administered 2021-03-06: 1000 mL

## 2021-03-06 MED ORDER — PROPOFOL 10 MG/ML IV BOLUS
INTRAVENOUS | Status: AC
  Filled 2021-03-06: qty 20

## 2021-03-06 MED ORDER — FENTANYL CITRATE (PF) 100 MCG/2ML IJ SOLN: 25.0000 ug | INTRAMUSCULAR | Status: DC | PRN

## 2021-03-06 MED ORDER — DEXAMETHASONE SODIUM PHOSPHATE 10 MG/ML IJ SOLN
INTRAMUSCULAR | Status: DC | PRN
  Administered 2021-03-06: 5 mg via INTRAVENOUS

## 2021-03-06 MED ORDER — ONDANSETRON HCL 4 MG/2ML IJ SOLN: 4.0000 mg | Freq: Once | INTRAMUSCULAR | Status: DC | PRN

## 2021-03-06 MED ORDER — FENTANYL CITRATE (PF) 250 MCG/5ML IJ SOLN
INTRAMUSCULAR | Status: DC | PRN
  Administered 2021-03-06: 50 ug via INTRAVENOUS

## 2021-03-06 MED ORDER — CEFAZOLIN SODIUM-DEXTROSE 2-4 GM/100ML-% IV SOLN
INTRAVENOUS | Status: AC
  Filled 2021-03-06: qty 100

## 2021-03-06 MED ORDER — ORAL CARE MOUTH RINSE: 15.0000 mL | Freq: Once | OROMUCOSAL | Status: AC

## 2021-03-06 MED ORDER — LIDOCAINE HCL (CARDIAC) PF 100 MG/5ML IV SOSY
PREFILLED_SYRINGE | INTRAVENOUS | Status: DC | PRN
  Administered 2021-03-06: 60 mg via INTRAVENOUS

## 2021-03-06 MED ORDER — MIDAZOLAM HCL 2 MG/2ML IJ SOLN
INTRAMUSCULAR | Status: AC
  Filled 2021-03-06: qty 2

## 2021-03-06 MED ORDER — LACTATED RINGERS IV SOLN
INTRAVENOUS | Status: DC
  Administered 2021-03-06: 1000 mL via INTRAVENOUS

## 2021-03-06 MED ORDER — CHLORHEXIDINE GLUCONATE 0.12 % MT SOLN
15.0000 mL | Freq: Once | OROMUCOSAL | Status: AC
  Administered 2021-03-06: 15 mL via OROMUCOSAL

## 2021-03-06 MED ORDER — PHENYLEPHRINE 40 MCG/ML (10ML) SYRINGE FOR IV PUSH (FOR BLOOD PRESSURE SUPPORT)
PREFILLED_SYRINGE | INTRAVENOUS | Status: AC
  Filled 2021-03-06: qty 10

## 2021-03-06 MED ORDER — PROPOFOL 10 MG/ML IV BOLUS
INTRAVENOUS | Status: DC | PRN
  Administered 2021-03-06: 150 mg via INTRAVENOUS

## 2021-03-06 MED ORDER — SODIUM CHLORIDE FLUSH 0.9 % IV SOLN
INTRAVENOUS | Status: DC | PRN
  Administered 2021-03-06: 5 mL

## 2021-03-06 MED ORDER — FENTANYL CITRATE (PF) 100 MCG/2ML IJ SOLN
INTRAMUSCULAR | Status: AC
  Filled 2021-03-06: qty 2

## 2021-03-06 MED ORDER — CEFAZOLIN SODIUM-DEXTROSE 2-4 GM/100ML-% IV SOLN
2.0000 g | INTRAVENOUS | Status: AC
  Administered 2021-03-06: 2 g via INTRAVENOUS

## 2021-03-06 SURGICAL SUPPLY — 17 items
DRAPE C-ARM 35X43 STRL (DRAPES) ×3 IMPLANT
DRSG TEGADERM 8X12 (GAUZE/BANDAGES/DRESSINGS) ×3 IMPLANT
GLOVE BIO SURGEON STRL SZ8 (GLOVE) ×3 IMPLANT
GLOVE ECLIPSE 8.0 STRL XLNG CF (GLOVE) ×3 IMPLANT
GLOVE SRG 8 PF TXTR STRL LF DI (GLOVE) ×2 IMPLANT
GLOVE SURG UNDER POLY LF SZ7 (GLOVE) ×3 IMPLANT
GLOVE SURG UNDER POLY LF SZ8 (GLOVE) ×3
GOWN STRL REUS W/TWL LRG LVL3 (GOWN DISPOSABLE) ×3 IMPLANT
GOWN STRL REUS W/TWL XL LVL3 (GOWN DISPOSABLE) ×3 IMPLANT
IMPL SPACEOAR SYSTEM 10ML (Spacer) ×2 IMPLANT
IMPLANT SPACEOAR SYSTEM 10ML (Spacer) ×3 IMPLANT
KIT TURNOVER CYSTO (KITS) ×3 IMPLANT
MARKER GOLD PRELOAD 1.2X3 (Urological Implant) ×2 IMPLANT
NS IRRIG 1000ML POUR BTL (IV SOLUTION) ×3 IMPLANT
PACK CYSTO (CUSTOM PROCEDURE TRAY) ×3 IMPLANT
SEED GOLD PRELOAD 1.2X3 (Urological Implant) ×3 IMPLANT
UNDERPAD 30X36 HEAVY ABSORB (UNDERPADS AND DIAPERS) ×6 IMPLANT

## 2021-03-06 NOTE — H&P (Signed)
HPI: Joseph Hansen is a 65yo here for ffiducial markers and SpaceOAR placement He has elected to proceed with IMRT. CT and bone scan revealed no evidence of metastatic disease. No complaints today     PMH:     Past Medical History:  Diagnosis Date   Deaf      doesn't know sign language   Hypertension        Surgical History:      Past Surgical History:  Procedure Laterality Date   CHOLECYSTECTOMY       PROSTATE BIOPSY N/A 12/05/2020    Procedure: PROSTATE BIOPSY;  Surgeon: Cleon Gustin, MD;  Location: [facility];  Service: Urology;  Laterality: N/A;      Home Medications:  Allergies as of 01/27/2021   No Known Allergies          Medication List         Accurate as of Jan 27, 2021 10:09 AM. If you have any questions, ask your nurse or doctor.          atorvastatin 20 MG tablet Commonly known as: LIPITOR Take 20 mg by mouth at bedtime.    labetalol 100 MG tablet Commonly known as: NORMODYNE Take 1 tablet (100 mg total) by mouth 2 (two) times daily.    levofloxacin 750 MG tablet Commonly known as: Levaquin Take 1 tablet (750 mg total) by mouth daily. To be taken the morning of procedure 11/16/2020    losartan 100 MG tablet Commonly known as: COZAAR Take 100 mg by mouth daily.           Allergies: No Known Allergies   Family History:      Family History  Problem Relation Age of Onset   Chronic Renal Failure Mother     Atrial fibrillation Mother     Diabetes Father     Heart attack Father        Social History:  reports that he has never smoked. He has never used smokeless tobacco. He reports that he does not drink alcohol and does not use drugs.   ROS: All other review of systems were reviewed and are negative except what is noted above in HPI   Physical Exam: BP (!) 178/106   Pulse 71   Temp 98.7 F (37.1 C)   Ht 5\' 8"  (1.727 m)   Wt 175 lb (79.4 kg)   BMI 26.61 kg/m   Constitutional:  Alert and oriented, No acute distress. HEENT: [place] AT,  moist mucus membranes.  Trachea midline, no masses. Cardiovascular: No clubbing, cyanosis, or edema. Respiratory: Normal respiratory effort, no increased work of breathing. GI: Abdomen is soft, nontender, nondistended, no abdominal masses GU: No CVA tenderness. Lymph: No cervical or inguinal lymphadenopathy. Skin: No rashes, bruises or suspicious lesions. Neurologic: Grossly intact, no focal deficits, moving all 4 extremities. Psychiatric: Normal mood and affect.   Laboratory Data: Recent Labs       Lab Results  Component Value Date    WBC 9.7 06/02/2019    HGB 13.9 06/02/2019    HCT 44.7 06/02/2019    MCV 87.5 06/02/2019    PLT 292 06/02/2019        Recent Labs       Lab Results  Component Value Date    CREATININE 1.40 (H) 01/20/2021        Recent Labs  No results found for: PSA     Recent Labs  No results found for: TESTOSTERONE     Recent  Labs  No results found for: HGBA1C     Urinalysis Labs (Brief)          Component Value Date/Time    COLORURINE YELLOW 05/08/2015 1730    APPEARANCEUR Clear 10/19/2020 0928    LABSPEC 1.020 05/08/2015 1730    PHURINE 5.5 05/08/2015 1730    GLUCOSEU Negative 10/19/2020 0928    HGBUR NEGATIVE 05/08/2015 1730    BILIRUBINUR Negative 10/19/2020 0928    KETONESUR NEGATIVE 05/08/2015 1730    PROTEINUR Negative 10/19/2020 0928    PROTEINUR NEGATIVE 05/08/2015 1730    UROBILINOGEN 0.2 05/08/2015 1730    NITRITE Negative 10/19/2020 0928    NITRITE NEGATIVE 05/08/2015 1730    LEUKOCYTESUR Negative 10/19/2020 0928        Recent Labs       Lab Results  Component Value Date    LABMICR Comment 10/19/2020    BACTERIA MANY (A) 03/01/2007        Pertinent Imaging: Ct and bone scan: Images reviewed and discussed with the patient and the family No results found for this or any previous visit.   No results found for this or any previous visit.   No results found for this or any previous visit.   No results found for  this or any previous visit.   No results found for this or any previous visit.   No results found for this or any previous visit.   No results found for this or any previous visit.   No results found for this or any previous visit.     Assessment & Plan:     1. Prostate cancer ([place]) -Risks/benefits/alternatives to fiducial markers and SpaceOAR placement was explained to the patient and he understands and wishes to proceed with surgery

## 2021-03-06 NOTE — Op Note (Signed)
PRE-OPERATIVE DIAGNOSIS:  Adenocarcinoma of the prostate  POST-OPERATIVE DIAGNOSIS:  Same  PROCEDURE: 1. Prostate Ultrasound 2. Placement of fiducial marks 3. Placement of SpaceOAR  SURGEON:  Surgeon(s): Joseph Bang, MD  ANESTHESIA:  General  EBL:  Minimal  DRAINS: none  INDICATION: Joseph Hansen is a 65 year old with a history of T1c prostate cancer who is scheduled to undergo IMRT. He wishes to have fiducial markers and SpaceOAR placed prior to IMRT to decrease rectal toxicity.  Description of procedure: After informed consent the patient was brought to the major OR, placed on the table and administered general anesthesia. He was then moved to the modified lithotomy position with his perineum perpendicular to the floor. His perineum and genitalia were then sterilely prepped. An official timeout was then performed. The transrectal ultrasound probe was placed in the rectum and affixed to the stand. He was then sterilely draped.  A transrectal ultrasound of the prostate was performed.  Prostae volume was 20.8cc. Lidocaine  was not  instilled using ultrasound guidance into the junction of each seminal vesicle of the prostate.  3 Gold markers were placed into the prostate using the standard template and ultrasound guidance.  Accurate placement of the markers was confirmed.  We then proceeded to mix the SpaceOAR using the kit supplied from the manufacturer. Once this was complete we placed a sinal needle into the perirectal fat between the rectum and the prostate. Once this was accomplished we injected 2cc of normal saline to hydrodissect the plain. We then instilled the the SpaceOAR through the spinal needle and noted good distribution in the perirectal fat.   The patient was awakened and taken to recovery room in stable and satisfactory condition. He tolerated procedure well and there were no intraoperative complications.  CONDITION: Stable, extubated, transferred to PACU  PLAN: The  patient is to be discharged home and he will start IMRT in the next 2-3 weeks

## 2021-03-06 NOTE — Anesthesia Preprocedure Evaluation (Signed)
Anesthesia Evaluation  Patient identified by MRN, date of birth, ID band Patient awake    Reviewed: Allergy & Precautions, NPO status , Patient's Chart, lab work & pertinent test results  History of Anesthesia Complications Negative for: history of anesthetic complications  Airway Mallampati: II  TM Distance: >3 FB Neck ROM: Full    Dental  (+) Edentulous Upper, Edentulous Lower   Pulmonary neg pulmonary ROS,    Pulmonary exam normal breath sounds clear to auscultation       Cardiovascular Exercise Tolerance: Good hypertension, Pt. on medications Normal cardiovascular exam Rhythm:Regular Rate:Normal  02-Mar-2021 09:09:22 [facility] ROUTINE RECORD June 15, 1956 (79 yr) Male [place]: Loc:905 Technician: Test ind: Vent. rate 59 BPM PR interval 178 ms QRS duration 96 ms QT/QTcB 452/447 ms P-R-T axes 23 4 211 Sinus bradycardia Left ventricular hypertrophy with strain pattern Abnormal ECG Confirmed by Asencion Noble [phone]) on 03/02/2021 12:27:13 PM   Neuro/Psych negative neurological ROS  negative psych ROS   GI/Hepatic negative GI ROS, Neg liver ROS,   Endo/Other  negative endocrine ROS  Renal/GU Renal InsufficiencyRenal disease     Musculoskeletal negative musculoskeletal ROS (+)   Abdominal   Peds  Hematology negative hematology ROS (+)   Anesthesia Other Findings Deaf   Reproductive/Obstetrics negative OB ROS                           Anesthesia Physical Anesthesia Plan  ASA: 2  Anesthesia Plan: General   Post-op Pain Management:    Induction: Intravenous  PONV Risk Score and Plan: 3 and Ondansetron  Airway Management Planned: LMA  Additional Equipment:   Intra-op Plan:   Post-operative Plan: Extubation in OR  Informed Consent: I have reviewed the patients History and Physical, chart, labs and discussed the procedure including the risks, benefits  and alternatives for the proposed anesthesia with the patient or authorized representative who has indicated his/her understanding and acceptance.     Dental advisory given  Plan Discussed with: CRNA and Surgeon  Anesthesia Plan Comments:         Anesthesia Quick Evaluation

## 2021-03-06 NOTE — Transfer of Care (Signed)
Immediate Anesthesia Transfer of Care Note  Patient: Joseph Hansen  Procedure(s) Performed: FIDUCIAL MARKER and space oar (Prostate)  Patient Location: PACU  Anesthesia Type:General  Level of Consciousness: awake  Airway & Oxygen Therapy: Patient Spontanous Breathing  Post-op Assessment: Report given to RN  Post vital signs: Reviewed and stable  Last Vitals:  Vitals Value Taken Time  BP 131/88 03/06/21 1339  Temp 36.5 C 03/06/21 1337  Pulse 58 03/06/21 1340  Resp 13 03/06/21 1340  SpO2 94 % 03/06/21 1340  Vitals shown include unvalidated device data.  Last Pain:  Vitals:   03/06/21 1206  TempSrc: Oral  PainSc: 0-No pain      Patients Stated Pain Goal: 9 (63/33/54 5625)  Complications: No notable events documented.

## 2021-03-06 NOTE — Anesthesia Postprocedure Evaluation (Signed)
Anesthesia Post Note  Patient: Joseph Hansen  Procedure(s) Performed: FIDUCIAL MARKER and space oar (Prostate)  Patient location during evaluation: PACU Anesthesia Type: General Level of consciousness: awake and alert and oriented Pain management: pain level controlled Vital Signs Assessment: post-procedure vital signs reviewed and stable Respiratory status: spontaneous breathing and respiratory function stable Cardiovascular status: blood pressure returned to baseline and stable Postop Assessment: no apparent nausea or vomiting Anesthetic complications: no   No notable events documented.   Last Vitals:  Vitals:   03/06/21 1400 03/06/21 1416  BP: 135/83 (!) 154/98  Pulse: 60 (!) 59  Resp: 16 16  Temp:  36.6 C  SpO2: 94% 94%    Last Pain:  Vitals:   03/06/21 1416  TempSrc: Oral  PainSc: 0-No pain                 Desi Carby C Baani Bober

## 2021-03-06 NOTE — Anesthesia Procedure Notes (Signed)
Procedure Name: LMA Insertion Date/Time: 03/06/2021 1:06 PM Performed by: Karna Dupes, CRNA Pre-anesthesia Checklist: Patient identified, Emergency Drugs available, Suction available and Patient being monitored Patient Re-evaluated:Patient Re-evaluated prior to induction Oxygen Delivery Method: Circle system utilized Preoxygenation: Pre-oxygenation with 100% oxygen Induction Type: IV induction LMA: LMA inserted LMA Size: 4.0 Number of attempts: 1 Tube secured with: Tape Dental Injury: Teeth and Oropharynx as per pre-operative assessment

## 2021-03-08 ENCOUNTER — Encounter (HOSPITAL_COMMUNITY): Payer: Self-pay | Admitting: Urology

## 2021-03-29 ENCOUNTER — Other Ambulatory Visit: Payer: Self-pay

## 2021-03-29 ENCOUNTER — Encounter: Payer: Self-pay | Admitting: Urology

## 2021-03-29 ENCOUNTER — Ambulatory Visit (INDEPENDENT_AMBULATORY_CARE_PROVIDER_SITE_OTHER): Payer: Medicare Other | Admitting: Urology

## 2021-03-29 VITALS — BP 151/92 | HR 85

## 2021-03-29 DIAGNOSIS — C61 Malignant neoplasm of prostate: Secondary | ICD-10-CM | POA: Diagnosis not present

## 2021-03-29 NOTE — Progress Notes (Signed)
03/29/2021 3:24 PM   Joseph Hansen Appl 13-Apr-1956 [id number]  Referring provider: Iona Beard, MD [address] [place],  Joseph Hansen [id number]  Followup prostate cancer   HPI: Joseph Hansen is a 65yo here for followup for prostate caner. He underwent fiducial markers and SPaceOAR 2 weeks ago. He is doing well since the procedure. No worsening LUTS. No hematuria or dysuria. No other complaints today   PMH: Past Medical History:  Diagnosis Date   Deaf    doesn't know sign language   Hypertension     Surgical History: Past Surgical History:  Procedure Laterality Date   CHOLECYSTECTOMY     PROSTATE BIOPSY N/A 12/05/2020   Procedure: PROSTATE BIOPSY;  Surgeon: Joseph Gustin, MD;  Location: [facility];  Service: Urology;  Laterality: N/A;   SPACE OAR INSTILLATION N/A 03/06/2021   Procedure: SPACE OAR INSTILLATION;  Surgeon: Joseph Gustin, MD;  Location: [facility];  Service: Urology;  Laterality: N/A;    Home Medications:  Allergies as of 03/29/2021   No Known Allergies      Medication List        Accurate as of March 29, 2021  3:24 PM. If you have any questions, ask your nurse or doctor.          atorvastatin 20 MG tablet Commonly known as: LIPITOR Take 20 mg by mouth at bedtime.   labetalol 100 MG tablet Commonly known as: NORMODYNE Take 1 tablet (100 mg total) by mouth 2 (two) times daily.   losartan 100 MG tablet Commonly known as: COZAAR Take 100 mg by mouth daily.        Allergies: No Known Allergies  Family History: Family History  Problem Relation Age of Onset   Chronic Renal Failure Mother    Atrial fibrillation Mother    Diabetes Father    Heart attack Father     Social History:  reports that he has never smoked. He has never used smokeless tobacco. He reports that he does not drink alcohol and does not use drugs.  ROS: All other review of systems were reviewed and are negative except what is noted above in HPI  Physical Exam: BP  (!) 151/92   Pulse 85   Constitutional:  Alert and oriented, No acute distress. HEENT: [place] AT, moist mucus membranes.  Trachea midline, no masses. Cardiovascular: No clubbing, cyanosis, or edema. Respiratory: Normal respiratory effort, no increased work of breathing. GI: Abdomen is soft, nontender, nondistended, no abdominal masses GU: No CVA tenderness.  Lymph: No cervical or inguinal lymphadenopathy. Skin: No rashes, bruises or suspicious lesions. Neurologic: Grossly intact, no focal deficits, moving all 4 extremities. Psychiatric: Normal mood and affect.  Laboratory Data: Lab Results  Component Value Date   WBC 9.7 06/02/2019   HGB 13.9 06/02/2019   HCT 44.7 06/02/2019   MCV 87.5 06/02/2019   PLT 292 06/02/2019    Lab Results  Component Value Date   CREATININE 1.40 (H) 01/20/2021    No results found for: PSA  No results found for: TESTOSTERONE  No results found for: HGBA1C  Urinalysis    Component Value Date/Time   COLORURINE YELLOW 05/08/2015 1730   APPEARANCEUR Clear 10/19/2020 0928   LABSPEC 1.020 05/08/2015 1730   PHURINE 5.5 05/08/2015 1730   GLUCOSEU Negative 10/19/2020 0928   HGBUR NEGATIVE 05/08/2015 1730   BILIRUBINUR Negative 10/19/2020 0928   KETONESUR NEGATIVE 05/08/2015 1730   PROTEINUR Negative 10/19/2020 [place] 05/08/2015 1730  UROBILINOGEN 0.2 05/08/2015 1730   NITRITE Negative 10/19/2020 0928   NITRITE NEGATIVE 05/08/2015 1730   LEUKOCYTESUR Negative 10/19/2020 0928    Lab Results  Component Value Date   LABMICR Comment 10/19/2020   BACTERIA MANY (A) 03/01/2007    Pertinent Imaging:  No results found for this or any previous visit.  No results found for this or any previous visit.  No results found for this or any previous visit.  No results found for this or any previous visit.  No results found for this or any previous visit.  No results found for this or any previous visit.  No results found for this or  any previous visit.  No results found for this or any previous visit.   Assessment & Plan:    1. Prostate cancer ([place]) -RTC 4 months for PSA and Eligard 45mg  - PSA; Future   Return in about 4 months (around 08/08/2021) for PSA and eligard 45mg .  Joseph Bang, MD  [facility] [place]

## 2021-03-29 NOTE — Progress Notes (Signed)

## 2021-04-15 NOTE — Patient Instructions (Signed)
Prostate Cancer  The prostate is a male gland that helps make semen. It is located below a man's bladder, in front of the rectum. Prostate cancer is when abnormal cells grow inthis gland. What are the causes? The cause of this condition is not known. What increases the risk? You are more likely to develop this condition if: You are 67 years of age or older. You are African American. You have a family history of prostate cancer. You have a family history of breast cancer. What are the signs or symptoms? Symptoms of this condition include: A need to pee often. Peeing that is weak, or pee that stops and starts. Trouble starting or stopping your pee. Inability to pee. Blood in your pee or semen. Pain in the lower back, lower belly (abdomen), hips, or upper thighs. Trouble getting an erection. Trouble emptying all of your pee. How is this treated? Treatment for this condition depends on your age, your health, the kind of treatment you like, and how far the cancer has spread. Treatments include: Being watched. This is called observation. You will be tested from time to time, but you will not get treated. Tests are to make sure that the cancer is not growing. Surgery. This may be done to remove the prostate, to remove the testicles, or to freeze or kill cancer cells. Radiation. This uses a strong beam to kill cancer cells. Ultrasound energy. This uses strong sound waves to kill cancer cells. Chemotherapy. This uses medicines that stop cancer cells from increasing. This kills cancer cells and healthy cells. Targeted therapy. This kills cancer cells only. Healthy cells are not affected. Hormone treatment. This stops the body from making hormones that help the cancer cells to grow. Follow these instructions at home: Take over-the-counter and prescription medicines only as told by your doctor. Eat a healthy diet. Get plenty of sleep. Ask your doctor for help to find a support group for men  with prostate cancer. If you have to go to the hospital, let your cancer doctor (oncologist) know. Treatment may affect your ability to have sex. Touch, hold, hug, and caress your partner to have intimate moments. Keep all follow-up visits as told by your doctor. This is important. Contact a doctor if: You have new or more trouble peeing. You have new or more blood in your pee. You have new or more pain in your hips, back, or chest. Get help right away if: You have weakness in your legs. You lose feeling in your legs. You cannot control your pee or your poop (stool). You have chills or a fever. Summary The prostate is a male gland that helps make semen. Prostate cancer is when abnormal cells grow in this gland. Treatment includes doing surgery, using medicines, using very strong beams, or watching without treatment. Ask your doctor for help to find a support group for men with prostate cancer. Contact a doctor if you have problems peeing or have any new pain that you did not have before. This information is not intended to replace advice given to you by your health care provider. Make sure you discuss any questions you have with your healthcare provider. Document Revised: 10/06/2020 Document Reviewed: 08/18/2019 Elsevier Patient Education  2022 Elsevier Inc.  

## 2021-06-13 ENCOUNTER — Other Ambulatory Visit: Payer: Self-pay | Admitting: Family Medicine

## 2021-06-13 ENCOUNTER — Other Ambulatory Visit (HOSPITAL_COMMUNITY): Payer: Self-pay | Admitting: Family Medicine

## 2021-06-13 DIAGNOSIS — R4189 Other symptoms and signs involving cognitive functions and awareness: Secondary | ICD-10-CM

## 2021-06-13 DIAGNOSIS — R413 Other amnesia: Secondary | ICD-10-CM

## 2021-06-14 ENCOUNTER — Other Ambulatory Visit: Payer: Self-pay

## 2021-06-14 ENCOUNTER — Ambulatory Visit (HOSPITAL_COMMUNITY)
Admission: RE | Admit: 2021-06-14 | Discharge: 2021-06-14 | Disposition: A | Discharge status: Home / Self Care | Payer: Medicare Other | Source: Ambulatory Visit | Attending: Family Medicine | Admitting: Family Medicine

## 2021-06-14 DIAGNOSIS — R413 Other amnesia: Secondary | ICD-10-CM | POA: Insufficient documentation

## 2021-06-14 DIAGNOSIS — R4189 Other symptoms and signs involving cognitive functions and awareness: Secondary | ICD-10-CM | POA: Insufficient documentation

## 2021-06-14 MED ORDER — IOHEXOL 350 MG/ML SOLN
60.0000 mL | Freq: Once | INTRAVENOUS | Status: AC | PRN
  Administered 2021-06-14: 60 mL via INTRAVENOUS

## 2021-06-15 LAB — POCT I-STAT CREATININE: Creatinine, Ser: 1.3 mg/dL — ABNORMAL HIGH (ref 0.61–1.24)

## 2021-07-24 ENCOUNTER — Other Ambulatory Visit: Payer: Self-pay

## 2021-07-24 ENCOUNTER — Other Ambulatory Visit: Payer: Medicare Other

## 2021-07-24 DIAGNOSIS — C61 Malignant neoplasm of prostate: Secondary | ICD-10-CM

## 2021-07-25 LAB — PSA: Prostate Specific Ag, Serum: 0.4 ng/mL (ref 0.0–4.0)

## 2021-07-31 ENCOUNTER — Encounter: Payer: Self-pay | Admitting: Urology

## 2021-07-31 ENCOUNTER — Other Ambulatory Visit: Payer: Self-pay

## 2021-07-31 ENCOUNTER — Ambulatory Visit (INDEPENDENT_AMBULATORY_CARE_PROVIDER_SITE_OTHER): Payer: Medicare Other | Admitting: Urology

## 2021-07-31 VITALS — BP 171/109 | HR 80

## 2021-07-31 DIAGNOSIS — N138 Other obstructive and reflux uropathy: Secondary | ICD-10-CM

## 2021-07-31 DIAGNOSIS — R3912 Poor urinary stream: Secondary | ICD-10-CM | POA: Diagnosis not present

## 2021-07-31 DIAGNOSIS — N401 Enlarged prostate with lower urinary tract symptoms: Secondary | ICD-10-CM | POA: Diagnosis not present

## 2021-07-31 DIAGNOSIS — C61 Malignant neoplasm of prostate: Secondary | ICD-10-CM

## 2021-07-31 MED ORDER — TAMSULOSIN HCL 0.4 MG PO CAPS: 0.4000 mg | ORAL_CAPSULE | Freq: Every day | ORAL | 11 refills | Status: DC

## 2021-07-31 MED ORDER — LEUPROLIDE ACETATE (6 MONTH) 45 MG ~~LOC~~ KIT
45.0000 mg | PACK | Freq: Once | SUBCUTANEOUS | Status: AC
  Administered 2021-07-31: 45 mg via SUBCUTANEOUS

## 2021-07-31 NOTE — Progress Notes (Signed)

## 2021-07-31 NOTE — Progress Notes (Signed)
07/31/2021 10:58 AM   [name] [birth year] [id number]  Referring provider: Iona Beard, MD [address] [address] [place],  [place] [address]  Followup prostate cancer   HPI: Joseph Hansen is a 65yo here for followup for prostate cancer. PSA decreased to 0.4. He completed IMRT September 2022. He is due for eligard 45mg  today. He has episodes of confusion on occasion. He has a weaker urinary stream, Nocturia 1-3x, urinary urgency with occasional urge incontinence,. Energy good.    PMH: Past Medical History:  Diagnosis Date   Deaf    doesn't know sign language   Hypertension     Surgical History: Past Surgical History:  Procedure Laterality Date   CHOLECYSTECTOMY     PROSTATE BIOPSY N/A 12/05/2020   Procedure: PROSTATE BIOPSY;  Surgeon: Cleon Gustin, MD;  Location: [facility];  Service: Urology;  Laterality: N/A;   SPACE OAR INSTILLATION N/A 03/06/2021   Procedure: SPACE OAR INSTILLATION;  Surgeon: Cleon Gustin, MD;  Location: [facility];  Service: Urology;  Laterality: N/A;    Home Medications:  Allergies as of 07/31/2021   No Known Allergies      Medication List        Accurate as of July 31, 2021 10:58 AM. If you have any questions, ask your nurse or doctor.          atorvastatin 20 MG tablet Commonly known as: LIPITOR Take 20 mg by mouth at bedtime.   labetalol 100 MG tablet Commonly known as: NORMODYNE Take 1 tablet (100 mg total) by mouth 2 (two) times daily.   losartan 100 MG tablet Commonly known as: COZAAR Take 100 mg by mouth daily.        Allergies: No Known Allergies  Family History: Family History  Problem Relation Age of Onset   Chronic Renal Failure Mother    Atrial fibrillation Mother    Diabetes Father    Heart attack Father     Social History:  reports that he has never smoked. He has never used smokeless tobacco. He reports that he does not drink alcohol and does not use drugs.  ROS: All other review of systems  were reviewed and are negative except what is noted above in HPI  Physical Exam: BP (!) 171/109 Comment: has not taken bp meds in 3 days  Pulse 80   Constitutional:  Alert and oriented, No acute distress. HEENT: [place] AT, moist mucus membranes.  Trachea midline, no masses. Cardiovascular: No clubbing, cyanosis, or edema. Respiratory: Normal respiratory effort, no increased work of breathing. GI: Abdomen is soft, nontender, nondistended, no abdominal masses GU: No CVA tenderness.  Lymph: No cervical or inguinal lymphadenopathy. Skin: No rashes, bruises or suspicious lesions. Neurologic: Grossly intact, no focal deficits, moving all 4 extremities. Psychiatric: Normal mood and affect.  Laboratory Data: Lab Results  Component Value Date   WBC 9.7 06/02/2019   HGB 13.9 06/02/2019   HCT 44.7 06/02/2019   MCV 87.5 06/02/2019   PLT 292 06/02/2019    Lab Results  Component Value Date   CREATININE 1.30 (H) 06/14/2021    No results found for: PSA  No results found for: TESTOSTERONE  No results found for: HGBA1C  Urinalysis    Component Value Date/Time   COLORURINE YELLOW 05/08/2015 1730   APPEARANCEUR Clear 10/19/2020 0928   LABSPEC 1.020 05/08/2015 1730   PHURINE 5.5 05/08/2015 1730   GLUCOSEU Negative 10/19/2020 0928   HGBUR NEGATIVE 05/08/2015 1730   BILIRUBINUR Negative  10/19/2020 0928   KETONESUR NEGATIVE 05/08/2015 1730   PROTEINUR Negative 10/19/2020 0928   PROTEINUR NEGATIVE 05/08/2015 1730   UROBILINOGEN 0.2 05/08/2015 1730   NITRITE Negative 10/19/2020 0928   NITRITE NEGATIVE 05/08/2015 1730   LEUKOCYTESUR Negative 10/19/2020 0928    Lab Results  Component Value Date   LABMICR Comment 10/19/2020   BACTERIA MANY (A) 03/01/2007    Pertinent Imaging:  No results found for this or any previous visit.  No results found for this or any previous visit.  No results found for this or any previous visit.  No results found for this or any previous visit.  No  results found for this or any previous visit.  No results found for this or any previous visit.  No results found for this or any previous visit.  No results found for this or any previous visit.   Assessment & Plan:    1. Prostate cancer ([place]) -RTC 6 months with PSA - leuprolide (6 Month) (ELIGARD) injection 45 mg  2. BPh with Weak urinary stream -We will start flomax 0.4mg  daily   No follow-ups on file.  Nicolette Bang, MD  [facility] [place]

## 2021-07-31 NOTE — Patient Instructions (Signed)

## 2021-08-18 ENCOUNTER — Ambulatory Visit
Admission: EM | Admit: 2021-08-18 | Discharge: 2021-08-18 | Disposition: A | Discharge status: Home / Self Care | Payer: Medicare Other | Attending: Family Medicine | Admitting: Family Medicine

## 2021-08-18 ENCOUNTER — Other Ambulatory Visit: Payer: Self-pay

## 2021-08-18 ENCOUNTER — Encounter: Payer: Self-pay | Admitting: Emergency Medicine

## 2021-08-18 DIAGNOSIS — J069 Acute upper respiratory infection, unspecified: Secondary | ICD-10-CM | POA: Diagnosis not present

## 2021-08-18 MED ORDER — PROMETHAZINE-DM 6.25-15 MG/5ML PO SYRP: 5.0000 mL | ORAL_SOLUTION | Freq: Four times a day (QID) | ORAL | 0 refills | Status: AC | PRN

## 2021-08-18 NOTE — ED Triage Notes (Signed)
Coughing since last night.    

## 2021-08-19 LAB — COVID-19, FLU A+B NAA
Influenza A, NAA: NOT DETECTED
Influenza B, NAA: NOT DETECTED
SARS-CoV-2, NAA: NOT DETECTED

## 2021-08-19 NOTE — ED Provider Notes (Signed)
[facility]    CSN: [id number] Arrival date & time: 08/18/21  2458      History   Chief Complaint No chief complaint on file.   HPI Joseph Hansen is a 65 y.o. male.   Presenting today with sister for evaluation of 1 day history of hacking cough.  Sister gives entire history today as he is nonverbal and deaf.  She denies notice of difficulty breathing, fever, body aches, vomiting, diarrhea.  Denies any known history of pulmonary disease.  She states he is taking Mucinex which does seem to be breaking up the mucus and he is now coughing some of it up.  Multiple sick contacts with colds recently.   Past Medical History:  Diagnosis Date   Deaf    doesn't know sign language   Hypertension     There are no problems to display for this patient.   Past Surgical History:  Procedure Laterality Date   CHOLECYSTECTOMY     PROSTATE BIOPSY N/A 12/05/2020   Procedure: PROSTATE BIOPSY;  Surgeon: Cleon Gustin, MD;  Location: [facility];  Service: Urology;  Laterality: N/A;   SPACE OAR INSTILLATION N/A 03/06/2021   Procedure: SPACE OAR INSTILLATION;  Surgeon: Cleon Gustin, MD;  Location: [facility];  Service: Urology;  Laterality: N/A;       Home Medications    Prior to Admission medications   Medication Sig Start Date End Date Taking? Authorizing Provider  promethazine-dextromethorphan (PROMETHAZINE-DM) 6.25-15 MG/5ML syrup Take 5 mLs by mouth 4 (four) times daily as needed. 08/18/21  Yes Volney American, PA-C  atorvastatin (LIPITOR) 20 MG tablet Take 20 mg by mouth at bedtime. 09/21/20   [provider]  labetalol (NORMODYNE) 100 MG tablet Take 1 tablet (100 mg total) by mouth 2 (two) times daily. 06/23/19   Josue Hector, MD  losartan (COZAAR) 100 MG tablet Take 100 mg by mouth daily.    [provider]  tamsulosin (FLOMAX) 0.4 MG CAPS capsule Take 1 capsule (0.4 mg total) by mouth daily after supper. 07/31/21   McKenzie, Candee Furbish, MD     Family History Family History  Problem Relation Age of Onset   Chronic Renal Failure Mother    Atrial fibrillation Mother    Diabetes Father    Heart attack Father     Social History Social History   Tobacco Use   Smoking status: Never   Smokeless tobacco: Never  Vaping Use   Vaping Use: Never used  Substance Use Topics   Alcohol use: No   Drug use: No     Allergies   Patient has no known allergies.   Review of Systems Review of Systems Per HPI  Physical Exam Triage Vital Signs ED Triage Vitals  Enc Vitals Group     BP 08/18/21 0818 124/86     Pulse Rate 08/18/21 0818 89     Resp 08/18/21 0818 18     Temp 08/18/21 0818 98.6 F (37 C)     Temp Source 08/18/21 0818 Oral     SpO2 08/18/21 0818 97 %     Weight --      Height --      Head Circumference --      Peak Flow --      Pain Score 08/18/21 0819 0     Pain Loc --      Pain Edu? --      Excl. in [place]? --    No data  found.  Updated Vital Signs BP 124/86 (BP Location: Right Arm)   Pulse 89   Temp 98.6 F (37 C) (Oral)   Resp 18   SpO2 97%   Visual Acuity Right Eye Distance:   Left Eye Distance:   Bilateral Distance:    Right Eye Near:   Left Eye Near:    Bilateral Near:     Physical Exam Vitals and nursing note reviewed.  Constitutional:      Appearance: Normal appearance.  HENT:     Head: Atraumatic.     Right Ear: Tympanic membrane normal.     Left Ear: Tympanic membrane normal.     Nose: Rhinorrhea present.     Mouth/Throat:     Mouth: Mucous membranes are moist.     Pharynx: Oropharynx is clear. Posterior oropharyngeal erythema present.  Eyes:     Extraocular Movements: Extraocular movements intact.     Conjunctiva/sclera: Conjunctivae normal.  Cardiovascular:     Rate and Rhythm: Normal rate and regular rhythm.  Pulmonary:     Effort: Pulmonary effort is normal.     Breath sounds: Normal breath sounds. No wheezing or rales.  Musculoskeletal:        General: Normal  range of motion.     Cervical back: Normal range of motion and neck supple.  Skin:    General: Skin is warm and dry.  Neurological:     General: No focal deficit present.     Mental Status: He is oriented to person, place, and time.     Motor: No weakness.     Gait: Gait normal.  Psychiatric:        Mood and Affect: Mood normal.        Thought Content: Thought content normal.        Judgment: Judgment normal.     UC Treatments / Results  Labs (all labs ordered are listed, but only abnormal results are displayed) Labs Reviewed  COVID-19, FLU A+B NAA   Narrative:    Performed at:  [address] [place], [place]  [id number] Lab Director: Rush Farmer MD, Phone:  [phone]    EKG   Radiology No results found.  Procedures Procedures (including critical care time)  Medications Ordered in UC Medications - No data to display  Initial Impression / Assessment and Plan / UC Course  I have reviewed the triage vital signs and the nursing notes.  Pertinent labs & imaging results that were available during my care of the patient were reviewed by me and considered in my medical decision making (see chart for details).     Vitals and exam overall reassuring, suspect early viral upper respiratory infection.  COVID and flu testing pending, will treat with Phenergan DM, supportive over-the-counter medications and home care.  Return for acutely worsening symptoms.  Final Clinical Impressions(s) / UC Diagnoses   Final diagnoses:  Viral URI with cough   Discharge Instructions   None    ED Prescriptions     Medication Sig Dispense Auth. Provider   promethazine-dextromethorphan (PROMETHAZINE-DM) 6.25-15 MG/5ML syrup Take 5 mLs by mouth 4 (four) times daily as needed. 100 mL Volney American, [place]      PDMP not reviewed this encounter.   Volney American, [place] 08/19/21 1705

## 2021-11-22 IMAGING — CT CT ABD-PEL WO/W CM
3 of 9 series · 12 of 46 positions shown, 18 images · IV contrast (omnipaque)
Comparison: CTA chest, abdomen and pelvis 06/02/2019

CLINICAL DATA: Prostate cancer, staging

EXAM:
CT ABDOMEN AND PELVIS WITHOUT AND WITH CONTRAST
TECHNIQUE: Multidetector CT imaging of the abdomen and pelvis was performed
following the standard protocol before and following the bolus
administration of intravenous contrast.
CONTRAST:  100mL OMNIPAQUE IOHEXOL 300 MG/ML  SOLN

[Series 3: axial st · axial · 0.82mm/px · z∈[-627,-267]mm · 6 of 102 slices shown, 11 images (1 of 2)]
[im 15/102  soft-tissue]
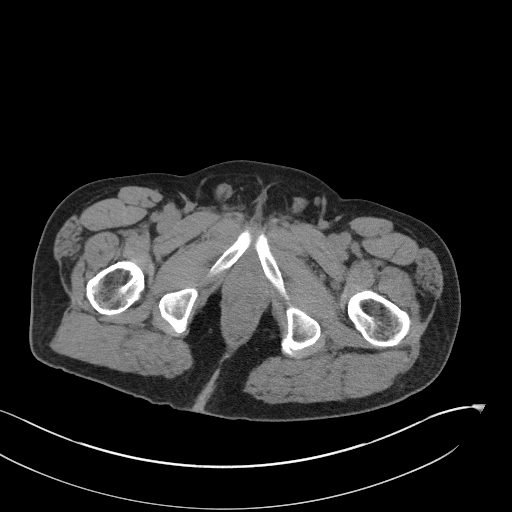
[im 15/102  bone]
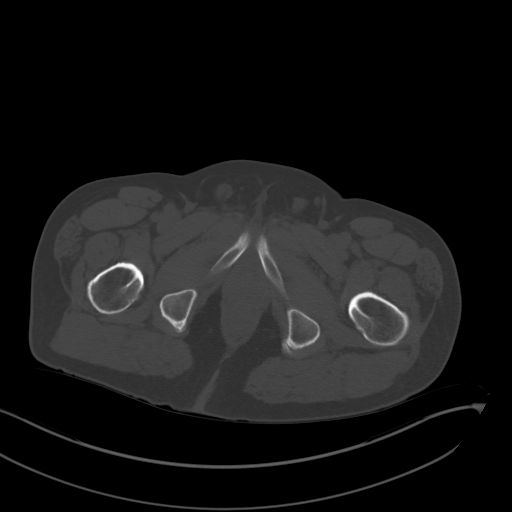
[im 29/102  soft-tissue]
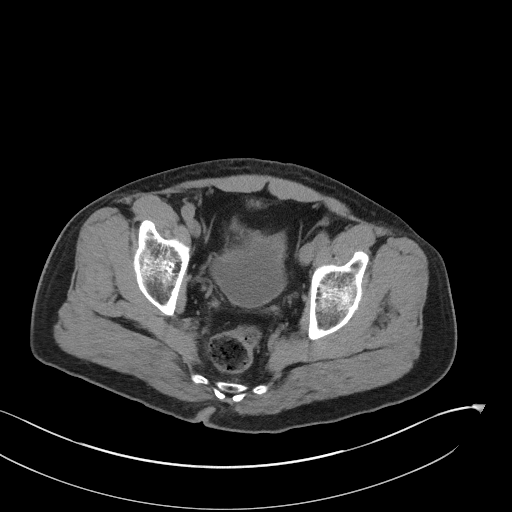
[im 44/102  soft-tissue]
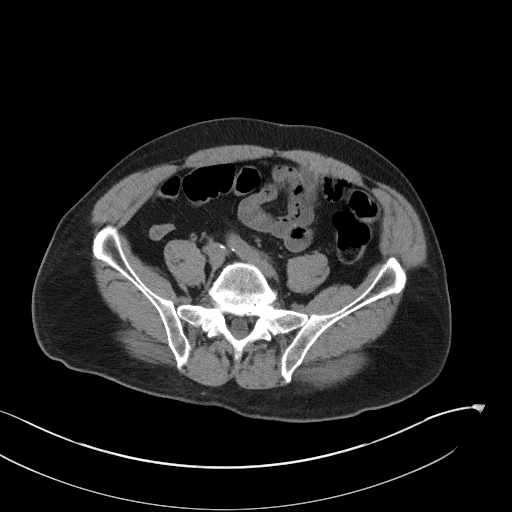
[im 44/102  lung]
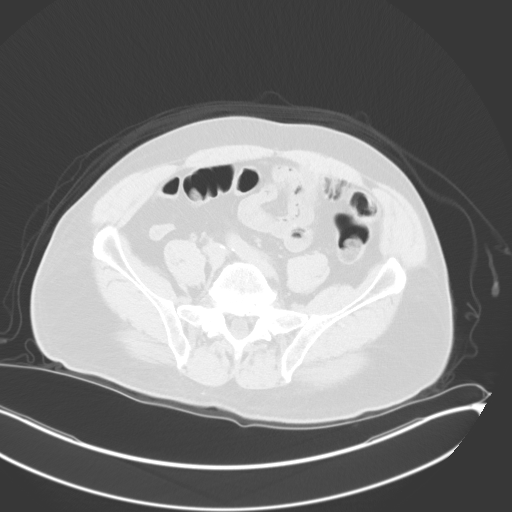
[im 58/102  soft-tissue]
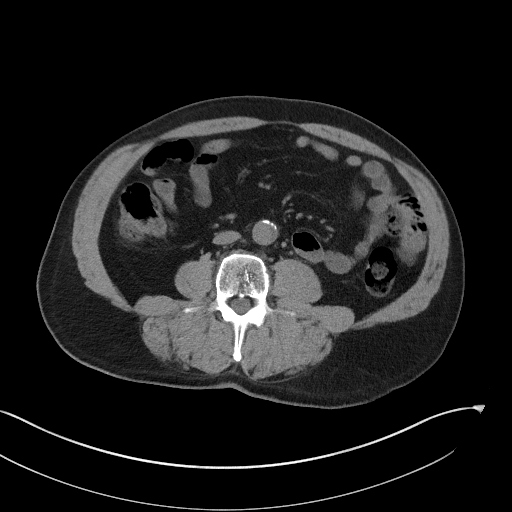
[im 58/102  lung]
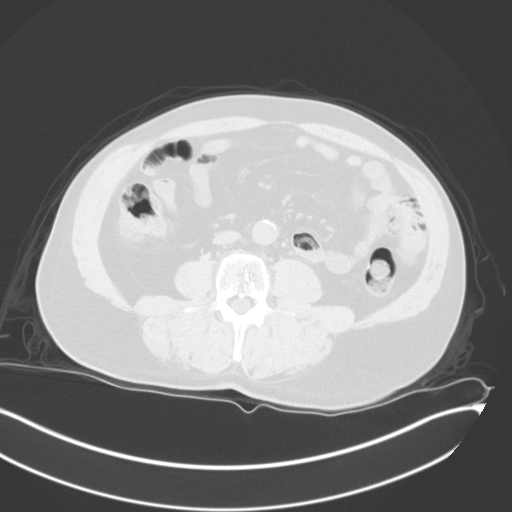
[im 73/102  soft-tissue]
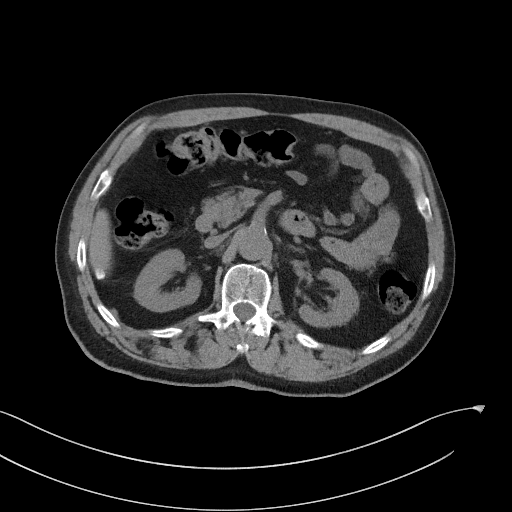
[im 73/102  lung]
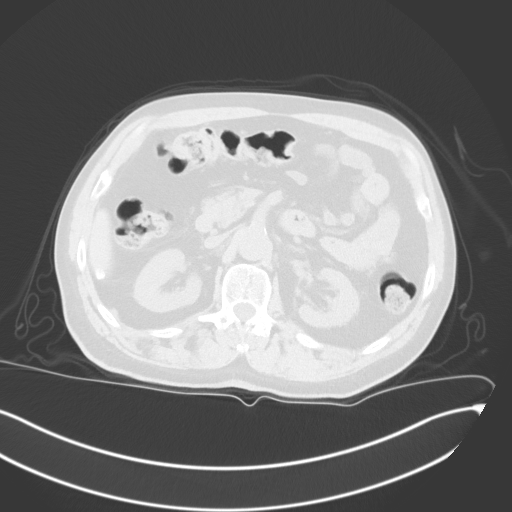
[im 87/102  soft-tissue]
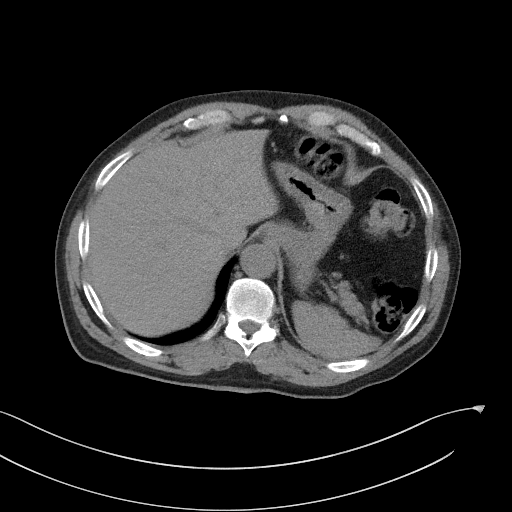
[im 87/102  lung]
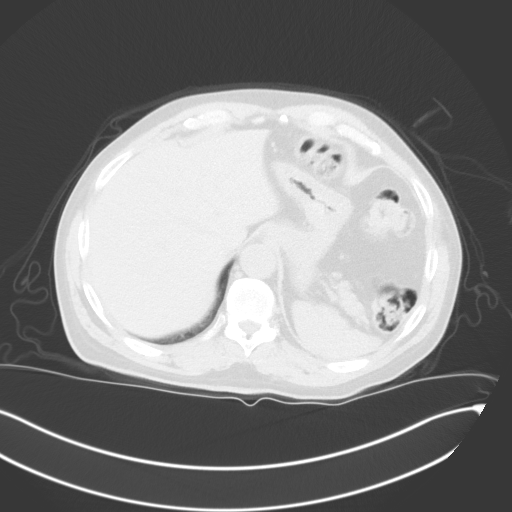

[Series 4: coronal st · coronal · 0.79mm/px · 3 of 100 slices shown, 4 images]
[im 25/100  soft-tissue]
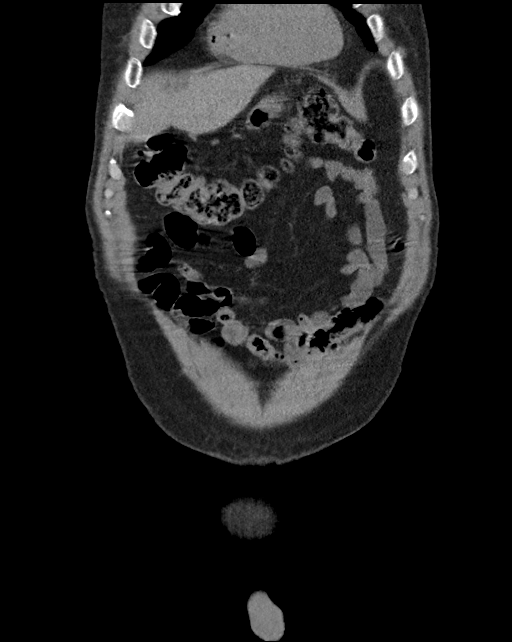
[im 50/100  soft-tissue]
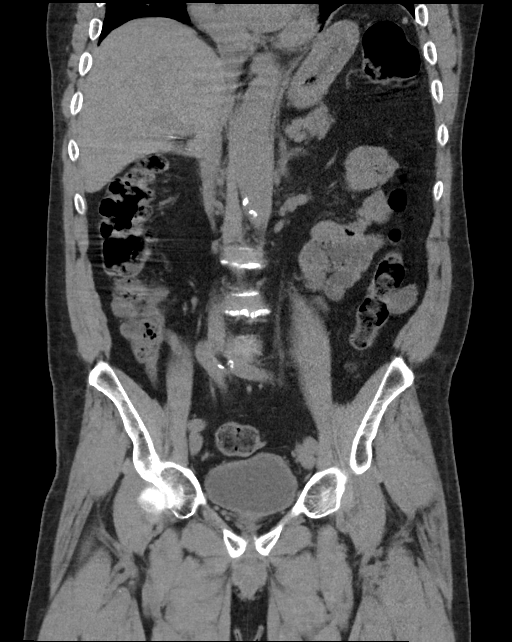
[im 50/100  bone]
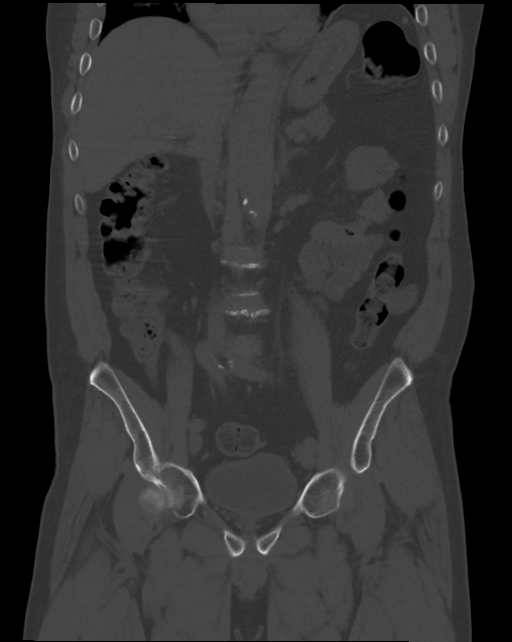
[im 75/100  soft-tissue]
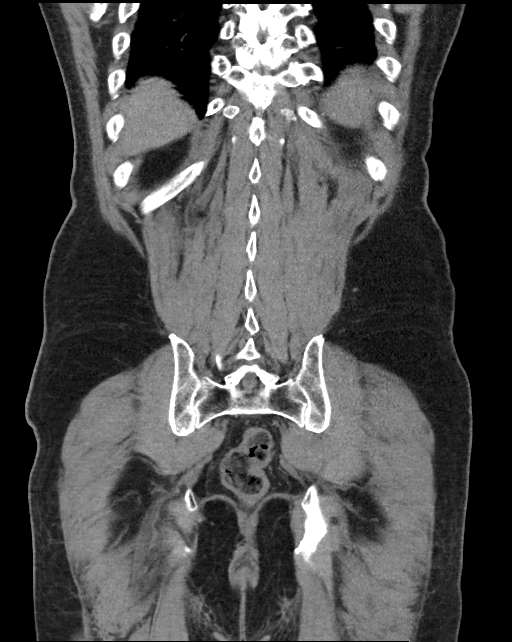

[Series 12: axial st · axial · 0.82mm/px · z∈[-617,-447]mm · 3 of 102 slices shown (2 of 2)]
[im 17/102  soft-tissue]
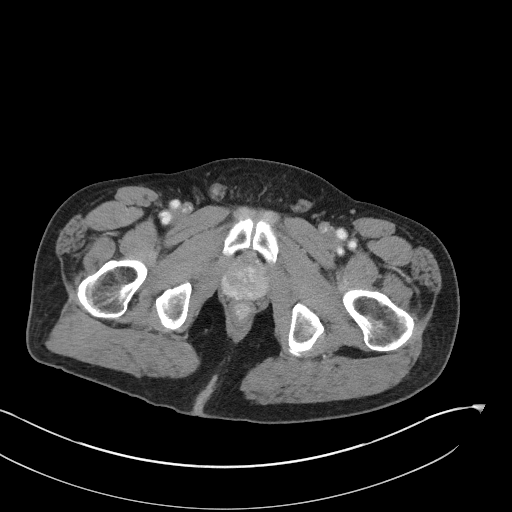
[im 34/102  soft-tissue]
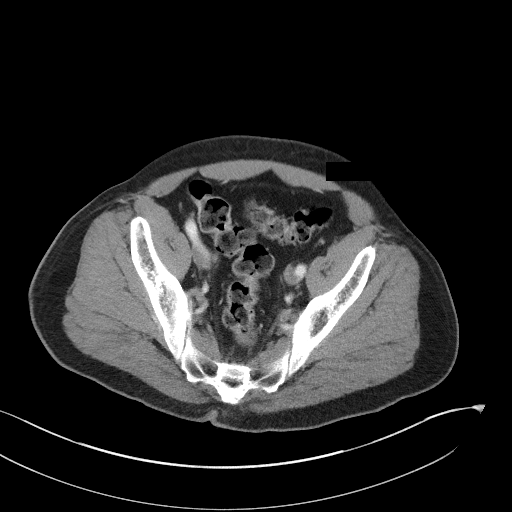
[im 51/102  soft-tissue]
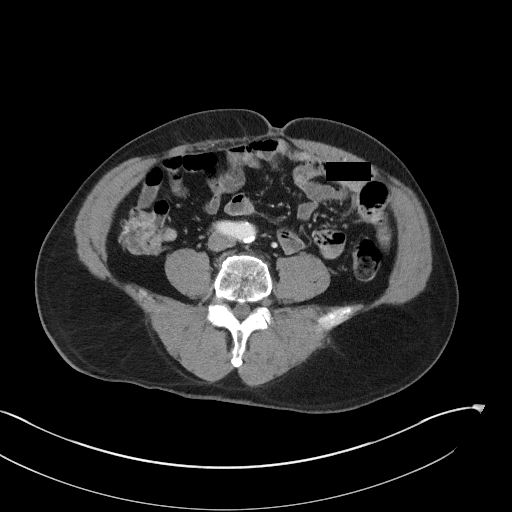

[12 of 46 positions shown; findings below may reference images not displayed]

FINDINGS: Lower chest: No acute abnormality.

Hepatobiliary: Unchanged calcification adjacent to the inferior
margin of right hepatic lobe likely representing a benign
abnormality. No suspicious liver lesion identified. Cholecystectomy.
No bile duct dilatation.

Pancreas: Unremarkable. No pancreatic ductal dilatation or
surrounding inflammatory changes.

Spleen: Normal in size without focal abnormality.

Adrenals/Urinary Tract: Normal adrenal glands. No kidney mass or
hydronephrosis. Symmetric opacification of the collecting systems on
the delayed images. No suspicious filling defects identified within
the upper urinary tract no focal bladder abnormality

Stomach/Bowel: Stomach is within normal limits. Appendix appears
normal. No evidence of bowel wall thickening, distention, or
inflammatory changes.

Vascular/Lymphatic: Aortic atherosclerosis without aneurysm. No
abdominopelvic adenopathy identified.

Reproductive: Prostate is unremarkable.

Other: No free fluid or fluid collections. No abdominal wall hernia.

Musculoskeletal: No acute or significant osseous findings.
Degenerative disc disease identified at L5-S1.
IMPRESSION: 1. No acute findings and no evidence for metastatic disease from
patient's prostate cancer.
2. Aortic atherosclerosis.

Aortic Atherosclerosis ([id number]).

## 2021-11-30 ENCOUNTER — Ambulatory Visit
Admission: EM | Admit: 2021-11-30 | Discharge: 2021-11-30 | Disposition: A | Discharge status: Home / Self Care | Payer: Medicare Other | Attending: Family Medicine | Admitting: Family Medicine

## 2021-11-30 ENCOUNTER — Other Ambulatory Visit: Payer: Self-pay

## 2021-11-30 DIAGNOSIS — J22 Unspecified acute lower respiratory infection: Secondary | ICD-10-CM | POA: Diagnosis not present

## 2021-11-30 MED ORDER — PREDNISONE 20 MG PO TABS: 40.0000 mg | ORAL_TABLET | Freq: Every day | ORAL | 0 refills | Status: AC

## 2021-11-30 MED ORDER — AZITHROMYCIN 250 MG PO TABS: ORAL_TABLET | ORAL | 0 refills | Status: AC

## 2021-11-30 MED ORDER — PROMETHAZINE-DM 6.25-15 MG/5ML PO SYRP: 5.0000 mL | ORAL_SOLUTION | Freq: Four times a day (QID) | ORAL | 0 refills | Status: AC | PRN

## 2021-11-30 NOTE — ED Triage Notes (Signed)
Pt's sister states he has had a cough for 2 weeks. She states it got better but now it is worse ? ?Pt's sister states that they tried Coricidin HP and Mucinex ? ?Denies any other symptoms besides cough ? ?Denies Fever ?

## 2021-12-04 NOTE — ED Provider Notes (Signed)
?[place] ? ? ? ?CSN: [id number] ?Arrival date & time: 11/30/21  7510 ? ? ?  ? ?History   ?Chief Complaint ?Chief Complaint  ?Patient presents with  ? Cough  ?  Cough and wheezing  ? ? ?HPI ?Joseph Hansen is a 66 y.o. male.  ? 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 She states for about 2 weeks that he's had a hacking cough that is now worsening, sometimes with wheezing. Denies fever, nasal congestion, sore throat, CP, SOB, abdominal pain, N/V/D. So far trying mucinex, coricidin with minimal relief. No known hx of chronic pulmonary dz or sick contacts.  ? ? ?Past Medical History:  ?Diagnosis Date  ? Deaf   ? doesn't know sign language  ? Hypertension   ? ? ?There are no problems to display for this patient. ? ? ?Past Surgical History:  ?Procedure Laterality Date  ? CHOLECYSTECTOMY    ? PROSTATE BIOPSY N/A 12/05/2020  ? Procedure: PROSTATE BIOPSY;  Surgeon: Cleon Gustin, MD;  Location: [facility];  Service: Urology;  Laterality: N/A;  ? SPACE OAR INSTILLATION N/A 03/06/2021  ? Procedure: SPACE OAR INSTILLATION;  Surgeon: Cleon Gustin, MD;  Location: [facility];  Service: Urology;  Laterality: N/A;  ? ? ? ? ? ?Home Medications   ? ?Prior to Admission medications   ?Medication Sig Start Date End Date Taking? Authorizing Provider  ?azithromycin (ZITHROMAX) 250 MG tablet Take first 2 tablets together, then 1 every day until finished. 11/30/21  Yes Volney American, PA-C  ?predniSONE (DELTASONE) 20 MG tablet Take 2 tablets (40 mg total) by mouth daily with breakfast. 11/30/21  Yes Volney American, PA-C  ?promethazine-dextromethorphan (PROMETHAZINE-DM) 6.25-15 MG/5ML syrup Take 5 mLs by mouth 4 (four) times daily as needed. 11/30/21  Yes Volney American, PA-C  ?atorvastatin (LIPITOR) 20 MG tablet Take 20 mg by mouth at bedtime. 09/21/20   [provider]  ?labetalol  (NORMODYNE) 100 MG tablet Take 1 tablet (100 mg total) by mouth 2 (two) times daily. 06/23/19   Josue Hector, MD  ?losartan (COZAAR) 100 MG tablet Take 100 mg by mouth daily.    [provider]  ?promethazine-dextromethorphan (PROMETHAZINE-DM) 6.25-15 MG/5ML syrup Take 5 mLs by mouth 4 (four) times daily as needed. 08/18/21   Volney American, PA-C  ?tamsulosin (FLOMAX) 0.4 MG CAPS capsule Take 1 capsule (0.4 mg total) by mouth daily after supper. 07/31/21   McKenzie, Candee Furbish, MD  ? ? ?Family History ?Family History  ?Problem Relation Age of Onset  ? Chronic Renal Failure Mother   ? Atrial fibrillation Mother   ? Diabetes Father   ? Heart attack Father   ? ? ?Social History ?Social History  ? ?Tobacco Use  ? Smoking status: Never  ? Smokeless tobacco: Never  ?Vaping Use  ? Vaping Use: Never used  ?Substance Use Topics  ? Alcohol use: No  ? Drug use: No  ? ? ? ?Allergies   ?Patient has no known allergies. ? ? ?Review of Systems ?Review of Systems ?PER HPI ? ?Physical Exam ?Triage Vital Signs ?ED Triage Vitals  ?Enc Vitals Group  ?   BP 11/30/21 1042 119/82  ?   Pulse Rate 11/30/21 1042 (!) 59  ?   Resp 11/30/21 1042 16  ?   Temp 11/30/21 1042 98.2 ?F (36.8 ?C)  ?   Temp  Source 11/30/21 1042 Oral  ?   SpO2 11/30/21 1042 97 %  ?   Weight --   ?   Height --   ?   Head Circumference --   ?   Peak Flow --   ?   Pain Score 11/30/21 1040 0  ?   Pain Loc --   ?   Pain Edu? --   ?   Excl. in [place]? --   ? ?No data found. ? ?Updated Vital Signs ?BP 119/82 (BP Location: Right Arm)   Pulse (!) 59   Temp 98.2 ?F (36.8 ?C) (Oral)   Resp 16   SpO2 97%  ? ?Visual Acuity ?Right Eye Distance:   ?Left Eye Distance:   ?Bilateral Distance:   ? ?Right Eye Near:   ?Left Eye Near:    ?Bilateral Near:    ? ?Physical Exam ?Vitals and nursing note reviewed.  ?Constitutional:   ?   Appearance: Normal appearance. He is well-developed.  ?HENT:  ?   Head: Atraumatic.  ?   Right Ear: External ear normal.  ?   Left Ear: External  ear normal.  ?   Nose: Nose normal.  ?   Mouth/Throat:  ?   Mouth: Mucous membranes are moist.  ?   Pharynx: Posterior oropharyngeal erythema present. No oropharyngeal exudate.  ?Eyes:  ?   Extraocular Movements: Extraocular movements intact.  ?   Conjunctiva/sclera: Conjunctivae normal.  ?   Pupils: Pupils are equal, round, and reactive to light.  ?Cardiovascular:  ?   Rate and Rhythm: Normal rate and regular rhythm.  ?Pulmonary:  ?   Effort: Pulmonary effort is normal. No respiratory distress.  ?   Breath sounds: Normal breath sounds. No wheezing or rales.  ?Musculoskeletal:     ?   General: Normal range of motion.  ?   Cervical back: Normal range of motion and neck supple.  ?Lymphadenopathy:  ?   Cervical: No cervical adenopathy.  ?Skin: ?   General: Skin is warm and dry.  ?Neurological:  ?   General: No focal deficit present.  ?   Mental Status: He is alert and oriented to person, place, and time.  ?Psychiatric:     ?   Mood and Affect: Mood normal.     ?   Behavior: Behavior normal.     ?   Thought Content: Thought content normal.     ?   Judgment: Judgment normal.  ? ? ? ?UC Treatments / Results  ?Labs ?(all labs ordered are listed, but only abnormal results are displayed) ?Labs Reviewed - No data to display ? ?EKG ? ? ?Radiology ?No results found. ? ?Procedures ?Procedures (including critical care time) ? ?Medications Ordered in UC ?Medications - No data to display ? ?Initial Impression / Assessment and Plan / UC Course  ?I have reviewed the triage vital signs and the nursing notes. ? ?Pertinent labs & imaging results that were available during my care of the patient were reviewed by me and considered in my medical decision making (see chart for details). ? ?  ? ?Given duration and worsening course will treat with azithromycin, prednisone, phenergan dm. Supportive care and return precautions reviewed. ? ?Final Clinical Impressions(s) / UC Diagnoses  ? ?Final diagnoses:  ?Lower respiratory infection   ? ?Discharge Instructions   ?None ?  ? ?ED Prescriptions   ? ? Medication Sig Dispense Auth. Provider  ? predniSONE (DELTASONE) 20 MG tablet Take 2 tablets (40 mg total)  by mouth daily with breakfast. 10 tablet Volney American, PA-C  ? azithromycin (ZITHROMAX) 250 MG tablet Take first 2 tablets together, then 1 every day until finished. 6 tablet Volney American, [place]  ? promethazine-dextromethorphan (PROMETHAZINE-DM) 6.25-15 MG/5ML syrup Take 5 mLs by mouth 4 (four) times daily as needed. 100 mL Volney American, [place]  ? ?  ? ?PDMP not reviewed this encounter. ?  ?Volney American, PA-C ?12/04/21 2156 ? ?

## 2021-12-27 ENCOUNTER — Ambulatory Visit (INDEPENDENT_AMBULATORY_CARE_PROVIDER_SITE_OTHER): Payer: Medicare Other | Admitting: Podiatry

## 2021-12-27 ENCOUNTER — Encounter: Payer: Self-pay | Admitting: Podiatry

## 2021-12-27 DIAGNOSIS — B351 Tinea unguium: Secondary | ICD-10-CM | POA: Diagnosis not present

## 2021-12-27 DIAGNOSIS — M79676 Pain in unspecified toe(s): Secondary | ICD-10-CM | POA: Diagnosis not present

## 2021-12-27 DIAGNOSIS — H9193 Unspecified hearing loss, bilateral: Secondary | ICD-10-CM

## 2021-12-27 DIAGNOSIS — H9190 Unspecified hearing loss, unspecified ear: Secondary | ICD-10-CM | POA: Insufficient documentation

## 2021-12-27 DIAGNOSIS — C61 Malignant neoplasm of prostate: Secondary | ICD-10-CM | POA: Insufficient documentation

## 2021-12-27 NOTE — Progress Notes (Signed)
?  Subjective:  ?Patient ID: Joseph Hansen, male    DOB: 1956-03-31,  MRN: [id number] ?HPI ?Chief Complaint  ?Patient presents with  ? Debridement  ?  Trim toenails   ? New Patient (Initial Visit)  ? ? ?66 y.o. male presents with the above complaint.  ? ?Patient is deaf and does not speak. ? ?ROS: Denies fever chills nausea vomiting muscle aches pains calf pain back pain chest pain shortness of breath. ? ?Past Medical History:  ?Diagnosis Date  ? Deaf   ? doesn't know sign language  ? Hypertension   ? ?Past Surgical History:  ?Procedure Laterality Date  ? CHOLECYSTECTOMY    ? PROSTATE BIOPSY N/A 12/05/2020  ? Procedure: PROSTATE BIOPSY;  Surgeon: Cleon Gustin, MD;  Location: [facility];  Service: Urology;  Laterality: N/A;  ? SPACE OAR INSTILLATION N/A 03/06/2021  ? Procedure: SPACE OAR INSTILLATION;  Surgeon: Cleon Gustin, MD;  Location: [facility];  Service: Urology;  Laterality: N/A;  ? ? ?Current Outpatient Medications:  ?  atorvastatin (LIPITOR) 20 MG tablet, Take 20 mg by mouth at bedtime., Disp: , Rfl:  ?  azithromycin (ZITHROMAX) 250 MG tablet, Take first 2 tablets together, then 1 every day until finished., Disp: 6 tablet, Rfl: 0 ?  labetalol (NORMODYNE) 100 MG tablet, Take 1 tablet (100 mg total) by mouth 2 (two) times daily., Disp: 180 tablet, Rfl: 3 ?  losartan (COZAAR) 100 MG tablet, Take 100 mg by mouth daily., Disp: , Rfl:  ?  predniSONE (DELTASONE) 20 MG tablet, Take 2 tablets (40 mg total) by mouth daily with breakfast., Disp: 10 tablet, Rfl: 0 ?  promethazine-dextromethorphan (PROMETHAZINE-DM) 6.25-15 MG/5ML syrup, Take 5 mLs by mouth 4 (four) times daily as needed., Disp: 100 mL, Rfl: 0 ?  promethazine-dextromethorphan (PROMETHAZINE-DM) 6.25-15 MG/5ML syrup, Take 5 mLs by mouth 4 (four) times daily as needed., Disp: 100 mL, Rfl: 0 ?  tamsulosin (FLOMAX) 0.4 MG CAPS capsule, Take 1 capsule (0.4 mg total) by mouth daily after supper., Disp: 30 capsule, Rfl: 11 ? ?Current Facility-Administered  Medications:  ?  degarelix (FIRMAGON) injection 240 mg, 240 mg, Subcutaneous, Once, McKenzie, Candee Furbish, MD ? ?No Known Allergies ?Review of Systems ?Objective:  ?There were no vitals filed for this visit. ? ?General: Well developed, nourished, in no acute distress, alert and oriented x3  ? ?Dermatological: Skin is warm, dry and supple bilateral. remaining integument appears unremarkable at this time. There are no open sores, no preulcerative lesions, no rash or signs of infection present.  Toenails are long thick yellow dystrophic clinically mycotic and poorly maintained ? ?Vascular: Dorsalis Pedis artery and Posterior Tibial artery pedal pulses are 2/4 bilateral with immedate capillary fill time. Pedal hair growth present. No varicosities and no lower extremity edema present bilateral.  ? ?Neruologic: Grossly intact via light touch bilateral. Vibratory intact via tuning fork bilateral. Protective threshold with Semmes Wienstein monofilament intact to all pedal sites bilateral. Patellar and Achilles deep tendon reflexes 2+ bilateral. No Babinski or clonus noted bilateral.  ? ?Musculoskeletal: No gross boney pedal deformities bilateral. No pain, crepitus, or limitation noted with foot and ankle range of motion bilateral. Muscular strength 5/5 in all groups tested bilateral. ? ?Gait: Unassisted, Nonantalgic.  ? ? ?Radiographs: ? ?None taken ? ?Assessment & Plan:  ? ?Assessment: Pain in limb secondary to onychomycosis. ? ?Plan: Debridement of toenails 1 through 5 bilateral.  Follow-up with Dr. Sharyon Cable for routine debridement. ? ? ? ? ?Domenica Weightman T. Montague, DPM ?

## 2022-01-04 DIAGNOSIS — M818 Other osteoporosis without current pathological fracture: Secondary | ICD-10-CM | POA: Insufficient documentation

## 2022-01-24 ENCOUNTER — Other Ambulatory Visit: Payer: Medicare Other

## 2022-01-24 DIAGNOSIS — C61 Malignant neoplasm of prostate: Secondary | ICD-10-CM

## 2022-01-25 LAB — PSA: Prostate Specific Ag, Serum: 0.2 ng/mL (ref 0.0–4.0)

## 2022-01-31 ENCOUNTER — Ambulatory Visit (INDEPENDENT_AMBULATORY_CARE_PROVIDER_SITE_OTHER): Payer: Medicare Other | Admitting: Urology

## 2022-01-31 ENCOUNTER — Encounter: Payer: Self-pay | Admitting: Urology

## 2022-01-31 VITALS — BP 109/72 | HR 76

## 2022-01-31 DIAGNOSIS — C61 Malignant neoplasm of prostate: Secondary | ICD-10-CM | POA: Diagnosis not present

## 2022-01-31 DIAGNOSIS — N138 Other obstructive and reflux uropathy: Secondary | ICD-10-CM

## 2022-01-31 DIAGNOSIS — R3912 Poor urinary stream: Secondary | ICD-10-CM | POA: Diagnosis not present

## 2022-01-31 DIAGNOSIS — N401 Enlarged prostate with lower urinary tract symptoms: Secondary | ICD-10-CM

## 2022-01-31 MED ORDER — LEUPROLIDE ACETATE (6 MONTH) 45 MG ~~LOC~~ KIT
45.0000 mg | PACK | Freq: Once | SUBCUTANEOUS | Status: AC
  Administered 2022-01-31: 45 mg via SUBCUTANEOUS

## 2022-01-31 NOTE — Patient Instructions (Signed)

## 2022-01-31 NOTE — Progress Notes (Signed)
01/31/2022 10:08 AM   Joseph Hansen Appl [birth year] [id number]  Referring provider: Iona Beard, MD [address] [place],  [place] [address]  Followup BPh and prostate cancer   HPI:  Joseph Hansen is a 66yo here for followup for prostate cancer and BPH. PSA decrease to 0.2 on ADT. Mild hot flashes. IPSS 3 QOl 0 off flomax 0.'4mg'$ . Urine stream strong. No straining to urinate. No other complaints today  PMH: Past Medical History:  Diagnosis Date   Deaf    doesn't know sign language   Hypertension     Surgical History: Past Surgical History:  Procedure Laterality Date   CHOLECYSTECTOMY     PROSTATE BIOPSY N/A 12/05/2020   Procedure: PROSTATE BIOPSY;  Surgeon: Cleon Gustin, MD;  Location: [facility];  Service: Urology;  Laterality: N/A;   SPACE OAR INSTILLATION N/A 03/06/2021   Procedure: SPACE OAR INSTILLATION;  Surgeon: Cleon Gustin, MD;  Location: [facility];  Service: Urology;  Laterality: N/A;    Home Medications:  Allergies as of 01/31/2022   No Known Allergies      Medication List        Accurate as of Jan 31, 2022 10:08 AM. If you have any questions, ask your nurse or doctor.          atorvastatin 20 MG tablet Commonly known as: LIPITOR Take 20 mg by mouth at bedtime.   azithromycin 250 MG tablet Commonly known as: ZITHROMAX Take first 2 tablets together, then 1 every day until finished.   labetalol 100 MG tablet Commonly known as: NORMODYNE Take 1 tablet (100 mg total) by mouth 2 (two) times daily.   losartan 100 MG tablet Commonly known as: COZAAR Take 100 mg by mouth daily.   predniSONE 20 MG tablet Commonly known as: DELTASONE Take 2 tablets (40 mg total) by mouth daily with breakfast.   promethazine-dextromethorphan 6.25-15 MG/5ML syrup Commonly known as: PROMETHAZINE-DM Take 5 mLs by mouth 4 (four) times daily as needed.   promethazine-dextromethorphan 6.25-15 MG/5ML syrup Commonly known as: PROMETHAZINE-DM Take 5 mLs by mouth 4  (four) times daily as needed.   tamsulosin 0.4 MG Caps capsule Commonly known as: FLOMAX Take 1 capsule (0.4 mg total) by mouth daily after supper.        Allergies: No Known Allergies  Family History: Family History  Problem Relation Age of Onset   Chronic Renal Failure Mother    Atrial fibrillation Mother    Diabetes Father    Heart attack Father     Social History:  reports that he has never smoked. He has never used smokeless tobacco. He reports that he does not drink alcohol and does not use drugs.  ROS: All other review of systems were reviewed and are negative except what is noted above in HPI  Physical Exam: BP 109/72   Pulse 76   Constitutional:  Alert and oriented, No acute distress. HEENT: [place] AT, moist mucus membranes.  Trachea midline, no masses. Cardiovascular: No clubbing, cyanosis, or edema. Respiratory: Normal respiratory effort, no increased work of breathing. GI: Abdomen is soft, nontender, nondistended, no abdominal masses GU: No CVA tenderness.  Lymph: No cervical or inguinal lymphadenopathy. Skin: No rashes, bruises or suspicious lesions. Neurologic: Grossly intact, no focal deficits, moving all 4 extremities. Psychiatric: Normal mood and affect.  Laboratory Data: Lab Results  Component Value Date   WBC 9.7 06/02/2019   HGB 13.9 06/02/2019   HCT 44.7 06/02/2019   MCV 87.5 06/02/2019  PLT 292 06/02/2019    Lab Results  Component Value Date   CREATININE 1.30 (H) 06/14/2021    No results found for: PSA  No results found for: TESTOSTERONE  No results found for: HGBA1C  Urinalysis    Component Value Date/Time   COLORURINE YELLOW 05/08/2015 1730   APPEARANCEUR Clear 10/19/2020 0928   LABSPEC 1.020 05/08/2015 1730   PHURINE 5.5 05/08/2015 1730   GLUCOSEU Negative 10/19/2020 0928   HGBUR NEGATIVE 05/08/2015 1730   BILIRUBINUR Negative 10/19/2020 0928   KETONESUR NEGATIVE 05/08/2015 1730   PROTEINUR Negative 10/19/2020 0928    PROTEINUR NEGATIVE 05/08/2015 1730   UROBILINOGEN 0.2 05/08/2015 1730   NITRITE Negative 10/19/2020 0928   NITRITE NEGATIVE 05/08/2015 1730   LEUKOCYTESUR Negative 10/19/2020 0928    Lab Results  Component Value Date   LABMICR Comment 10/19/2020   BACTERIA MANY (A) 03/01/2007    Pertinent Imaging:  No results found for this or any previous visit.  No results found for this or any previous visit.  No results found for this or any previous visit.  No results found for this or any previous visit.  No results found for this or any previous visit.  No results found for this or any previous visit.  No results found for this or any previous visit.  No results found for this or any previous visit.   Assessment & Plan:    1. Prostate cancer ([place]) -RTC 6 months with PSA and for eligard '45mg'$   - Urinalysis, Routine w reflex microscopic - leuprolide (6 Month) (ELIGARD) injection 45 mg - PSA; Future  2. Benign prostatic hyperplasia with urinary obstruction -cpatient defers therapy at this time  3. Weak urinary stream -Patient defers therapy at this time   No follow-ups on file.  Nicolette Bang, MD  [facility] [place]

## 2022-03-29 ENCOUNTER — Encounter: Payer: Self-pay | Admitting: Podiatry

## 2022-03-29 ENCOUNTER — Ambulatory Visit (INDEPENDENT_AMBULATORY_CARE_PROVIDER_SITE_OTHER): Payer: Medicare Other | Admitting: Podiatry

## 2022-03-29 DIAGNOSIS — M79676 Pain in unspecified toe(s): Secondary | ICD-10-CM

## 2022-03-29 DIAGNOSIS — B351 Tinea unguium: Secondary | ICD-10-CM | POA: Diagnosis not present

## 2022-03-29 DIAGNOSIS — H9193 Unspecified hearing loss, bilateral: Secondary | ICD-10-CM

## 2022-03-29 NOTE — Progress Notes (Signed)
This patient returns to the office for evaluation and treatment of long thick painful nails .  This patient is unable to trim his own nails since the patient cannot reach his feet.  Patient says the nails are painful walking and wearing his shoes. He presents to the office with his caregiver. He returns for preventive foot care services.  General Appearance  Alert, conversant and in no acute stress.  Vascular  Dorsalis pedis and posterior tibial  pulses are palpable  bilaterally.  Capillary return is within normal limits  bilaterally. Temperature is within normal limits  bilaterally.  Neurologic  Senn-Weinstein monofilament wire test within normal limits  bilaterally. Muscle power within normal limits bilaterally.  Nails Thick disfigured discolored nails with subungual debris  from hallux to fifth toes bilaterally. No evidence of bacterial infection or drainage bilaterally.  Orthopedic  No limitations of motion  feet .  No crepitus or effusions noted.  No bony pathology or digital deformities noted.  Skin  normotropic skin with no porokeratosis noted bilaterally.  No signs of infections or ulcers noted.     Onychomycosis  Pain in toes right foot  Pain in toes left foot  Debridement  of nails  1-5  B/L with a nail nipper.  Nails were then filed using a dremel tool with no incidents.    RTC 3 months    Sahas Sluka DPM   

## 2022-07-05 ENCOUNTER — Ambulatory Visit: Payer: Medicare Other | Admitting: Podiatry

## 2022-07-24 ENCOUNTER — Other Ambulatory Visit: Payer: Medicare Other

## 2022-07-24 DIAGNOSIS — C61 Malignant neoplasm of prostate: Secondary | ICD-10-CM

## 2022-07-25 LAB — PSA: Prostate Specific Ag, Serum: 0.2 ng/mL (ref 0.0–4.0)

## 2022-08-03 ENCOUNTER — Ambulatory Visit (INDEPENDENT_AMBULATORY_CARE_PROVIDER_SITE_OTHER): Payer: Medicare Other | Admitting: Urology

## 2022-08-03 VITALS — BP 183/99 | HR 73

## 2022-08-03 DIAGNOSIS — R972 Elevated prostate specific antigen [PSA]: Secondary | ICD-10-CM

## 2022-08-03 DIAGNOSIS — N138 Other obstructive and reflux uropathy: Secondary | ICD-10-CM

## 2022-08-03 DIAGNOSIS — C61 Malignant neoplasm of prostate: Secondary | ICD-10-CM | POA: Diagnosis not present

## 2022-08-03 DIAGNOSIS — N401 Enlarged prostate with lower urinary tract symptoms: Secondary | ICD-10-CM

## 2022-08-03 DIAGNOSIS — R3912 Poor urinary stream: Secondary | ICD-10-CM

## 2022-08-03 MED ORDER — LEUPROLIDE ACETATE (6 MONTH) 45 MG ~~LOC~~ KIT
45.0000 mg | PACK | Freq: Once | SUBCUTANEOUS | Status: AC
  Administered 2022-08-03: 45 mg via SUBCUTANEOUS

## 2022-08-03 NOTE — Progress Notes (Signed)
08/03/2022 10:52 AM   Joseph Hansen Appl [birth year] [id number]  Referring provider: Iona Beard, MD [address] [place],  [place] [address]  Followup prostate cancer   HPI: Joseph Hansen is a 66yo here for followup for prostate cancer and BPH. IPSS 1 QOL 0 on no BPH therapy. Urine stream strong. Mild hot flashes on ADT. PSA 0.2. He is due for eligard '45mg'$  today which is his last dose of ADT.    PMH: Past Medical History:  Diagnosis Date   Deaf    doesn't know sign language   Hypertension     Surgical History: Past Surgical History:  Procedure Laterality Date   CHOLECYSTECTOMY     PROSTATE BIOPSY N/A 12/05/2020   Procedure: PROSTATE BIOPSY;  Surgeon: Cleon Gustin, MD;  Location: [facility];  Service: Urology;  Laterality: N/A;   SPACE OAR INSTILLATION N/A 03/06/2021   Procedure: SPACE OAR INSTILLATION;  Surgeon: Cleon Gustin, MD;  Location: [facility];  Service: Urology;  Laterality: N/A;    Home Medications:  Allergies as of 08/03/2022   No Known Allergies      Medication List        Accurate as of August 03, 2022 10:52 AM. If you have any questions, ask your nurse or doctor.          atorvastatin 20 MG tablet Commonly known as: LIPITOR Take 20 mg by mouth at bedtime.   azithromycin 250 MG tablet Commonly known as: ZITHROMAX Take first 2 tablets together, then 1 every day until finished.   labetalol 100 MG tablet Commonly known as: NORMODYNE Take 1 tablet (100 mg total) by mouth 2 (two) times daily.   losartan 100 MG tablet Commonly known as: COZAAR Take 100 mg by mouth daily.   predniSONE 20 MG tablet Commonly known as: DELTASONE Take 2 tablets (40 mg total) by mouth daily with breakfast.   promethazine-dextromethorphan 6.25-15 MG/5ML syrup Commonly known as: PROMETHAZINE-DM Take 5 mLs by mouth 4 (four) times daily as needed.   promethazine-dextromethorphan 6.25-15 MG/5ML syrup Commonly known as: PROMETHAZINE-DM Take 5 mLs by mouth  4 (four) times daily as needed.        Allergies: No Known Allergies  Family History: Family History  Problem Relation Age of Onset   Chronic Renal Failure Mother    Atrial fibrillation Mother    Diabetes Father    Heart attack Father     Social History:  reports that he has never smoked. He has never used smokeless tobacco. He reports that he does not drink alcohol and does not use drugs.  ROS: All other review of systems were reviewed and are negative except what is noted above in HPI  Physical Exam: BP (!) 183/99   Pulse 73   Constitutional:  Alert and oriented, No acute distress. HEENT: [place] AT, moist mucus membranes.  Trachea midline, no masses. Cardiovascular: No clubbing, cyanosis, or edema. Respiratory: Normal respiratory effort, no increased work of breathing. GI: Abdomen is soft, nontender, nondistended, no abdominal masses GU: No CVA tenderness.  Lymph: No cervical or inguinal lymphadenopathy. Skin: No rashes, bruises or suspicious lesions. Neurologic: Grossly intact, no focal deficits, moving all 4 extremities. Psychiatric: Normal mood and affect.  Laboratory Data: Lab Results  Component Value Date   WBC 9.7 06/02/2019   HGB 13.9 06/02/2019   HCT 44.7 06/02/2019   MCV 87.5 06/02/2019   PLT 292 06/02/2019    Lab Results  Component Value Date   CREATININE  1.30 (H) 06/14/2021    No results found for: "PSA"  No results found for: "TESTOSTERONE"  No results found for: "HGBA1C"  Urinalysis    Component Value Date/Time   COLORURINE YELLOW 05/08/2015 1730   APPEARANCEUR Clear 10/19/2020 0928   LABSPEC 1.020 05/08/2015 1730   PHURINE 5.5 05/08/2015 1730   GLUCOSEU Negative 10/19/2020 0928   HGBUR NEGATIVE 05/08/2015 1730   BILIRUBINUR Negative 10/19/2020 0928   KETONESUR NEGATIVE 05/08/2015 1730   PROTEINUR Negative 10/19/2020 0928   PROTEINUR NEGATIVE 05/08/2015 1730   UROBILINOGEN 0.2 05/08/2015 1730   NITRITE Negative 10/19/2020 0928    NITRITE NEGATIVE 05/08/2015 1730   LEUKOCYTESUR Negative 10/19/2020 0928    Lab Results  Component Value Date   LABMICR Comment 10/19/2020   BACTERIA MANY (A) 03/01/2007    Pertinent Imaging:  No results found for this or any previous visit.  No results found for this or any previous visit.  No results found for this or any previous visit.  No results found for this or any previous visit.  No results found for this or any previous visit.  No valid procedures specified. No results found for this or any previous visit.  No results found for this or any previous visit.   Assessment & Plan:    1. Prostate cancer ([place]) -RTC 6 months with PSA - Urinalysis, Routine w reflex microscopic - leuprolide (6 Month) (ELIGARD) injection 45 mg  2.  Benign prostatic hyperplasia with urinary obstruction -patient defers therapy at this time  3. Weak urinary stream -resolved   No follow-ups on file.  Nicolette Bang, MD  [facility] [place]

## 2022-08-12 ENCOUNTER — Encounter: Payer: Self-pay | Admitting: Urology

## 2022-08-12 NOTE — Patient Instructions (Signed)

## 2022-09-20 ENCOUNTER — Ambulatory Visit (INDEPENDENT_AMBULATORY_CARE_PROVIDER_SITE_OTHER): Payer: Medicare Other | Admitting: Podiatry

## 2022-09-20 ENCOUNTER — Encounter: Payer: Self-pay | Admitting: Podiatry

## 2022-09-20 VITALS — BP 109/71 | HR 71

## 2022-09-20 DIAGNOSIS — B351 Tinea unguium: Secondary | ICD-10-CM | POA: Diagnosis not present

## 2022-09-20 DIAGNOSIS — M79676 Pain in unspecified toe(s): Secondary | ICD-10-CM

## 2022-09-20 DIAGNOSIS — H9193 Unspecified hearing loss, bilateral: Secondary | ICD-10-CM

## 2022-09-20 NOTE — Progress Notes (Signed)
This patient returns to the office for evaluation and treatment of long thick painful nails .  This patient is unable to trim his own nails since the patient cannot reach his feet.  Patient says the nails are painful walking and wearing his shoes. He presents to the office with his caregiver. He returns for preventive foot care services.  General Appearance  Alert, conversant and in no acute stress.  Vascular  Dorsalis pedis and posterior tibial  pulses are palpable  bilaterally.  Capillary return is within normal limits  bilaterally. Temperature is within normal limits  bilaterally.  Neurologic  Senn-Weinstein monofilament wire test within normal limits  bilaterally. Muscle power within normal limits bilaterally.  Nails Thick disfigured discolored nails with subungual debris  from hallux to fifth toes bilaterally. No evidence of bacterial infection or drainage bilaterally.  Orthopedic  No limitations of motion  feet .  No crepitus or effusions noted.  No bony pathology or digital deformities noted.  Skin  normotropic skin with no porokeratosis noted bilaterally.  No signs of infections or ulcers noted.     Onychomycosis  Pain in toes right foot  Pain in toes left foot  Debridement  of nails  1-5  B/L with a nail nipper.  Nails were then filed using a dremel tool with no incidents.    RTC 3 months    Gardiner Barefoot DPM

## 2022-12-20 ENCOUNTER — Ambulatory Visit (INDEPENDENT_AMBULATORY_CARE_PROVIDER_SITE_OTHER): Payer: Medicare Other | Admitting: Podiatry

## 2022-12-20 DIAGNOSIS — B351 Tinea unguium: Secondary | ICD-10-CM

## 2022-12-20 DIAGNOSIS — M79676 Pain in unspecified toe(s): Secondary | ICD-10-CM

## 2022-12-20 NOTE — Progress Notes (Signed)
This patient returns to the office for evaluation and treatment of long thick painful nails .  This patient is unable to trim his own nails since the patient cannot reach his feet.  Patient says the nails are painful walking and wearing his shoes. He presents to the office with his caregiver. He returns for preventive foot care services.  General Appearance  Alert, conversant and in no acute stress.  Vascular  Dorsalis pedis and posterior tibial  pulses are palpable  bilaterally.  Capillary return is within normal limits  bilaterally. Temperature is within normal limits  bilaterally.  Neurologic  Senn-Weinstein monofilament wire test within normal limits  bilaterally. Muscle power within normal limits bilaterally.  Nails Thick disfigured discolored nails with subungual debris  from hallux to fifth toes bilaterally. No evidence of bacterial infection or drainage bilaterally.  Orthopedic  No limitations of motion  feet .  No crepitus or effusions noted.  No bony pathology or digital deformities noted.  Skin  normotropic skin with no porokeratosis noted bilaterally.  No signs of infections or ulcers noted.     Onychomycosis  Pain in toes right foot  Pain in toes left foot  Debridement  of nails  1-5  B/L with a nail nipper.  Nails were then filed using a dremel tool with no incidents.    RTC 3 months    Khiree Bukhari DPM   

## 2023-02-08 ENCOUNTER — Ambulatory Visit (INDEPENDENT_AMBULATORY_CARE_PROVIDER_SITE_OTHER): Payer: Medicare Other | Admitting: Urology

## 2023-02-08 VITALS — BP 115/74 | HR 74

## 2023-02-08 DIAGNOSIS — C61 Malignant neoplasm of prostate: Secondary | ICD-10-CM | POA: Diagnosis not present

## 2023-02-08 DIAGNOSIS — N138 Other obstructive and reflux uropathy: Secondary | ICD-10-CM | POA: Diagnosis not present

## 2023-02-08 DIAGNOSIS — N401 Enlarged prostate with lower urinary tract symptoms: Secondary | ICD-10-CM | POA: Diagnosis not present

## 2023-02-08 DIAGNOSIS — R3912 Poor urinary stream: Secondary | ICD-10-CM

## 2023-02-08 NOTE — Progress Notes (Unsigned)
02/08/2023 10:43 AM   Joseph Hansen Joseph Hansen December 19, 1955 [id number]  Referring provider: Mirna Mires, MD [address] [place],  [place] [address]  No chief complaint on file.   HPI:  PSA 0.08 in 11/2022. No hot flashes. IPSS 1 QOL 0.  Urine stream strong. NO straining to urinate.   PMH: Past Medical History:  Diagnosis Date   Deaf    doesn't know sign language   Hypertension     Surgical History: Past Surgical History:  Procedure Laterality Date   CHOLECYSTECTOMY     PROSTATE BIOPSY N/A 12/05/2020   Procedure: PROSTATE BIOPSY;  Surgeon: Joseph Gauze, MD;  Location: [facility];  Service: Urology;  Laterality: N/A;   SPACE OAR INSTILLATION N/A 03/06/2021   Procedure: SPACE OAR INSTILLATION;  Surgeon: Joseph Gauze, MD;  Location: [facility];  Service: Urology;  Laterality: N/A;    Home Medications:  Allergies as of 02/08/2023   No Known Allergies      Medication List        Accurate as of Feb 08, 2023 10:43 AM. If you have any questions, ask your nurse or doctor.          atorvastatin 20 MG tablet Commonly known as: LIPITOR Take 20 mg by mouth at bedtime.   azithromycin 250 MG tablet Commonly known as: ZITHROMAX Take first 2 tablets together, then 1 every day until finished.   labetalol 100 MG tablet Commonly known as: NORMODYNE Take 1 tablet (100 mg total) by mouth 2 (two) times daily.   losartan 100 MG tablet Commonly known as: COZAAR Take 100 mg by mouth daily.   predniSONE 20 MG tablet Commonly known as: DELTASONE Take 2 tablets (40 mg total) by mouth daily with breakfast.   promethazine-dextromethorphan 6.25-15 MG/5ML syrup Commonly known as: PROMETHAZINE-DM Take 5 mLs by mouth 4 (four) times daily as needed.   promethazine-dextromethorphan 6.25-15 MG/5ML syrup Commonly known as: PROMETHAZINE-DM Take 5 mLs by mouth 4 (four) times daily as needed.        Allergies: No Known Allergies  Family History: Family History  Problem  Relation Age of Onset   Chronic Renal Failure Mother    Atrial fibrillation Mother    Diabetes Father    Heart attack Father     Social History:  reports that he has never smoked. He has never used smokeless tobacco. He reports that he does not drink alcohol and does not use drugs.  ROS: All other review of systems were reviewed and are negative except what is noted above in HPI  Physical Exam: BP 115/74   Pulse 74   Constitutional:  Alert and oriented, No acute distress. HEENT: [place] AT, moist mucus membranes.  Trachea midline, no masses. Cardiovascular: No clubbing, cyanosis, or edema. Respiratory: Normal respiratory effort, no increased work of breathing. GI: Abdomen is soft, nontender, nondistended, no abdominal masses GU: No CVA tenderness.  Lymph: No cervical or inguinal lymphadenopathy. Skin: No rashes, bruises or suspicious lesions. Neurologic: Grossly intact, no focal deficits, moving all 4 extremities. Psychiatric: Normal mood and affect.  Laboratory Data: Lab Results  Component Value Date   WBC 9.7 06/02/2019   HGB 13.9 06/02/2019   HCT 44.7 06/02/2019   MCV 87.5 06/02/2019   PLT 292 06/02/2019    Lab Results  Component Value Date   CREATININE 1.30 (H) 06/14/2021    No results found for: "PSA"  No results found for: "TESTOSTERONE"  No results found for: "HGBA1C"  Urinalysis  Component Value Date/Time   COLORURINE YELLOW 05/08/2015 1730   APPEARANCEUR Clear 10/19/2020 0928   LABSPEC 1.020 05/08/2015 1730   PHURINE 5.5 05/08/2015 1730   GLUCOSEU Negative 10/19/2020 0928   HGBUR NEGATIVE 05/08/2015 1730   BILIRUBINUR Negative 10/19/2020 0928   KETONESUR NEGATIVE 05/08/2015 1730   PROTEINUR Negative 10/19/2020 0928   PROTEINUR NEGATIVE 05/08/2015 1730   UROBILINOGEN 0.2 05/08/2015 1730   NITRITE Negative 10/19/2020 0928   NITRITE NEGATIVE 05/08/2015 1730   LEUKOCYTESUR Negative 10/19/2020 0928    Lab Results  Component Value Date   LABMICR  Comment 10/19/2020   BACTERIA MANY (A) 03/01/2007    Pertinent Imaging: *** No results found for this or any previous visit.  No results found for this or any previous visit.  No results found for this or any previous visit.  No results found for this or any previous visit.  No results found for this or any previous visit.  No valid procedures specified. No results found for this or any previous visit.  No results found for this or any previous visit.   Assessment & Plan:    1. Prostate cancer (HCC) *** - Urinalysis, Routine w reflex microscopic  2. Benign prostatic hyperplasia with urinary obstruction ***  3. Weak urinary stream ***   No follow-ups on file.  Joseph Aye, MD  [facility] [place]

## 2023-02-12 ENCOUNTER — Encounter: Payer: Self-pay | Admitting: Urology

## 2023-02-12 NOTE — Patient Instructions (Signed)

## 2023-03-28 ENCOUNTER — Ambulatory Visit (INDEPENDENT_AMBULATORY_CARE_PROVIDER_SITE_OTHER): Payer: Medicare Other | Admitting: Podiatry

## 2023-03-28 DIAGNOSIS — Z91199 Patient's noncompliance with other medical treatment and regimen due to unspecified reason: Secondary | ICD-10-CM

## 2023-03-28 NOTE — Progress Notes (Signed)
1. No-show for appointment     

## 2023-08-06 ENCOUNTER — Other Ambulatory Visit: Payer: Medicare Other

## 2023-08-12 ENCOUNTER — Ambulatory Visit: Payer: Medicare Other | Admitting: Urology

## 2023-10-14 ENCOUNTER — Other Ambulatory Visit: Payer: Medicare Other

## 2023-10-14 DIAGNOSIS — C61 Malignant neoplasm of prostate: Secondary | ICD-10-CM

## 2023-10-15 LAB — PSA: Prostate Specific Ag, Serum: 0.1 ng/mL (ref 0.0–4.0)

## 2023-10-23 ENCOUNTER — Ambulatory Visit: Payer: Medicare Other | Admitting: Urology

## 2023-11-07 ENCOUNTER — Encounter: Payer: Self-pay | Admitting: Podiatry

## 2023-11-07 ENCOUNTER — Ambulatory Visit (INDEPENDENT_AMBULATORY_CARE_PROVIDER_SITE_OTHER): Payer: Medicare Other | Admitting: Podiatry

## 2023-11-07 DIAGNOSIS — M79676 Pain in unspecified toe(s): Secondary | ICD-10-CM

## 2023-11-07 DIAGNOSIS — B351 Tinea unguium: Secondary | ICD-10-CM

## 2023-11-07 DIAGNOSIS — B353 Tinea pedis: Secondary | ICD-10-CM | POA: Diagnosis not present

## 2023-11-07 DIAGNOSIS — L0889 Other specified local infections of the skin and subcutaneous tissue: Secondary | ICD-10-CM | POA: Diagnosis not present

## 2023-11-07 MED ORDER — KETOCONAZOLE 2 % EX CREA: TOPICAL_CREAM | CUTANEOUS | 1 refills | Status: AC

## 2023-11-07 NOTE — Progress Notes (Signed)
  Subjective:  Patient ID: Joseph Hansen, male    DOB: 1956/09/17,  MRN: [id number]  68 y.o. male presents to clinic with  painful elongated mycotic toenails 1-5 bilaterally which are tender when wearing enclosed shoe gear. Pain is relieved with periodic professional debridement. He is deaf and is accompanied by caregiver on today's visit. His sister is his POA. Chief Complaint  Patient presents with   Nail Problem    "Toenails"   New problem(s): None   PCP is Mirna Mires, MD.  No Known Allergies  Review of Systems: Negative except as noted in the HPI.   Objective:  Joseph Hansen is a pleasant 68 y.o. male WD, WN in NAD. AAO x 3.  Vascular Examination: Vascular status intact b/l with palpable pedal pulses. CFT immediate b/l. No edema. No pain with calf compression b/l. Skin temperature gradient WNL b/l. No cyanosis or clubbing noted b/l LE.  Neurological Examination: Sensation grossly intact b/l with 10 gram monofilament. Vibratory sensation intact b/l.   Dermatological Examination: Pedal skin with normal turgor, texture and tone b/l. Toenails 1-5 b/l thick, discolored, elongated with subungual debris and pain on dorsal palpation. No hyperkeratotic lesions noted b/l. Diffuse scaling noted peripherally and plantarly b/l feet.  No interdigital macerations.  No blisters, no weeping. No signs of secondary bacterial infection noted.  Musculoskeletal Examination: Muscle strength 5/5 to b/l LE. No pain, crepitus or joint limitation noted with ROM bilateral LE. No gross bony deformities bilaterally.  Radiographs: None  Last A1c:       No data to display          Assessment:   1. Pain due to onychomycosis of toenail   2. Tinea pedis of both feet    Plan:   Meds ordered this encounter  Medications   ketoconazole (NIZORAL) 2 % cream    Sig: Use gloves to apply to both feet and between toes once daily for 6 weeks.    Dispense:  60 g    Refill:  1   -Caregiver/provider  present with patient on today's visit. -Patient to continue soft, supportive shoe gear daily. -Toenails 1-5 b/l were debrided in length and girth with sterile nail nippers and dremel without iatrogenic bleeding.  -Discussed tinea pedis infection. To prevent re-infection of tinea pedis, patient/POA/caregiver instructed to spray shoes with Lysol every evening and clean tub/shower with bleach based cleanser. -For tinea pedis, Rx sent to pharmacy for Ketoconazole Cream 2% to be applied once daily for six weeks. -Patient/POA to call should there be question/concern in the interim.  Return in about 3 months (around 02/04/2024).  Freddie Breech, DPM      [place] LOCATION: [address], [place] [address]                   Office [phone]   [facility] LOCATION: [address] [place], [place] [address] Office [phone]

## 2023-11-07 NOTE — Patient Instructions (Signed)
 To prevent reinfection, spray shoes with lysol every evening.  Clean tub or shower with bleach based cleanser.  Athlete's Foot Athlete's foot (tinea pedis) is a fungal infection of the skin on your feet. It often occurs on the skin that is between or underneath the toes. It can also occur on the soles of your feet. The infection can spread from person to person (is contagious). It can also spread when a person's bare feet come in contact with the fungus on shower floors or on items such as shoes. What are the causes? This condition is caused by a fungus that grows in warm, moist places. You can get athlete's foot by sharing shoes, shower stalls, towels, and wet floors with someone who is infected. Not washing your feet or changing your socks often enough can also lead to athlete's foot. What increases the risk? This condition is more likely to develop in: Men. People who have a weak body defense system (immune system). People who have diabetes. People who use public showers, such as at a gym. People who wear heavy-duty shoes, such as industrial or military shoes. Seasons with warm, humid weather. What are the signs or symptoms? Symptoms of this condition include: Itchy areas between your toes or on the soles of your feet. White, flaky, or scaly areas between your toes or on the soles of your feet. Very itchy small blisters between your toes or on the soles of your feet. Small cuts in your skin. These cuts can become infected. Thick or discolored toenails. How is this diagnosed? This condition may be diagnosed with a physical exam and a review of your medical history. Your health care provider may also take a skin or toenail sample to examine under a microscope. How is this treated? This condition is treated with antifungal medicines. These may be applied as powders, ointments, or creams. In severe cases, an oral antifungal medicine may be given. Follow these instructions at  home: Medicines Apply or take over-the-counter and prescription medicines only as told by your health care provider. Apply your antifungal medicine as told by your health care provider. Do not stop using the antifungal even if your condition improves. Foot care Do not scratch your feet. Keep your feet dry: Wear cotton or wool socks. Change your socks every day or if they become wet. Wear shoes that allow air to flow, such as sandals or canvas tennis shoes. Wash and dry your feet, including the area between your toes. Also, wash and dry your feet: Every day or as told by your health care provider. After exercising. General instructions Do not let others use towels, shoes, nail clippers, or other personal items that touch your feet. Protect your feet by wearing sandals in wet areas, such as locker rooms and shared showers. Keep all follow-up visits. This is important. If you have diabetes, keep your blood sugar under control. Contact a health care provider if: You have a fever. You have swelling, soreness, warmth, or redness in your foot. Your feet are not getting better with treatment. Your symptoms get worse. You have new symptoms. You have severe pain. Summary Athlete's foot (tinea pedis) is a fungal infection of the skin on your feet. It often occurs on skin that is between or underneath the toes. This condition is caused by a fungus that grows in warm, moist places. Symptoms include white, flaky, or scaly areas between your toes or on the soles of your feet. This condition is treated with antifungal medicines.   Keep your feet clean. Always dry them thoroughly. This information is not intended to replace advice given to you by your health care provider. Make sure you discuss any questions you have with your health care provider. Document Revised: 12/25/2020 Document Reviewed: 12/25/2020 Elsevier Patient Education  2024 [employer]  

## 2023-12-25 ENCOUNTER — Other Ambulatory Visit: Payer: Self-pay

## 2023-12-25 ENCOUNTER — Ambulatory Visit: Payer: Medicare Other | Admitting: Urology

## 2023-12-25 ENCOUNTER — Telehealth: Payer: Self-pay

## 2023-12-25 NOTE — Telephone Encounter (Signed)
 Pt sister called to reschedule appt due to her being at the hospital with her God father appt scheduled for 07/11 @ 12:30PM

## 2024-02-06 ENCOUNTER — Ambulatory Visit (INDEPENDENT_AMBULATORY_CARE_PROVIDER_SITE_OTHER): Payer: Medicare Other | Admitting: Podiatry

## 2024-02-06 DIAGNOSIS — Z91199 Patient's noncompliance with other medical treatment and regimen due to unspecified reason: Secondary | ICD-10-CM

## 2024-02-07 NOTE — Progress Notes (Signed)
 1. No-show for appointment

## 2024-03-27 ENCOUNTER — Ambulatory Visit: Admitting: Urology

## 2024-07-01 DIAGNOSIS — Z6823 Body mass index (BMI) 23.0-23.9, adult: Secondary | ICD-10-CM | POA: Diagnosis not present

## 2024-07-01 DIAGNOSIS — I10 Essential (primary) hypertension: Secondary | ICD-10-CM | POA: Diagnosis not present

## 2024-07-01 DIAGNOSIS — R7303 Prediabetes: Secondary | ICD-10-CM | POA: Diagnosis not present

## 2024-07-20 ENCOUNTER — Ambulatory Visit: Admitting: Podiatry

## 2024-07-20 DIAGNOSIS — M79676 Pain in unspecified toe(s): Secondary | ICD-10-CM | POA: Diagnosis not present

## 2024-07-20 DIAGNOSIS — B351 Tinea unguium: Secondary | ICD-10-CM

## 2024-07-26 ENCOUNTER — Encounter: Payer: Self-pay | Admitting: Podiatry

## 2024-07-26 NOTE — Progress Notes (Signed)
  Subjective:  Patient ID: Joseph Hansen, male    DOB: 1955/12/11,  MRN: [id number]  Joseph LELON [place] presents to clinic today for preventative diabetic foot care for painful elongated mycotic toenails 1-5 bilaterally which are tender when wearing enclosed shoe gear. Pain is relieved with periodic professional debridement. Patient is deaf and is accompanied by caregiver, Kenney, on today's visit. Chief Complaint  Patient presents with   Toe Pain    Dr. Leigh is his PCP. Was there x 4 weeks ago. Denies being diabetic   New problem(s): None.   PCP is Leigh Lung, MD.  No Known Allergies  Review of Systems: Negative except as noted in the HPI.  Objective: No changes noted in today's physical examination. There were no vitals filed for this visit. Joseph Hansen is a pleasant 68 y.o. male WD, WN in NAD. AAO x 3.  Neurovascular status intact bilaterally and symmetrically.  Dermatological Examination: Pedal skin with normal turgor, texture and tone b/l.  No open wounds. No interdigital macerations.   Toenails 1-5 b/l thick, discolored, elongated with subungual debris and pain on dorsal palpation.   No hyperkeratotic nor porokeratotic lesions.  Musculoskeletal Examination: Muscle strength 5/5 to all lower extremity muscle groups bilaterally. No pain, crepitus or joint limitation noted with ROM b/l LE. No gross bony pedal deformities b/l. Patient ambulates independently without assistive aids.  Radiographs: None  Assessment/Plan: 1. Pain due to onychomycosis of toenail   -Patient was evaluated today. All questions/concerns addressed on today's visit. -Caregiver/provider present with patient on today's visit. -Patient to continue soft, supportive shoe gear daily. -Mycotic toenails 1-5 bilaterally were debrided in length and girth with sterile nail nippers and dremel. Pinpoint bleeding of left great toe and R 5th toe addressed with Lumicain Hemostatic Solution, cleansed with  alcohol. Triple antibiotic ointment applied. No further treatment required by patient/caregiver. -Patient/POA to call should there be question/concern in the interim.   No follow-ups on file.  Delon LITTIE Merlin, DPM      [place] LOCATION: [address] [place] [address]                   Office [phone]   [facility] LOCATION: [address] [place], [place] [address] Office [phone]

## 2024-11-02 ENCOUNTER — Ambulatory Visit: Admitting: Podiatry
